# Patient Record
Sex: Male | Born: 1940
Health system: Southern US, Community
[De-identification: ages and names within clinical notes are randomized; demographics above are authoritative.]

## PROBLEM LIST (undated history)

## (undated) DIAGNOSIS — J309 Allergic rhinitis, unspecified: Secondary | ICD-10-CM

## (undated) DIAGNOSIS — G709 Myoneural disorder, unspecified: Secondary | ICD-10-CM

## (undated) DIAGNOSIS — N529 Male erectile dysfunction, unspecified: Secondary | ICD-10-CM

## (undated) DIAGNOSIS — M1990 Unspecified osteoarthritis, unspecified site: Secondary | ICD-10-CM

## (undated) DIAGNOSIS — N400 Enlarged prostate without lower urinary tract symptoms: Secondary | ICD-10-CM

## (undated) DIAGNOSIS — L02219 Cutaneous abscess of trunk, unspecified: Secondary | ICD-10-CM

## (undated) DIAGNOSIS — T7840XA Allergy, unspecified, initial encounter: Secondary | ICD-10-CM

## (undated) DIAGNOSIS — L03319 Cellulitis of trunk, unspecified: Secondary | ICD-10-CM

## (undated) DIAGNOSIS — C439 Malignant melanoma of skin, unspecified: Secondary | ICD-10-CM

## (undated) DIAGNOSIS — H9190 Unspecified hearing loss, unspecified ear: Secondary | ICD-10-CM

## (undated) DIAGNOSIS — H269 Unspecified cataract: Secondary | ICD-10-CM

## (undated) DIAGNOSIS — H6690 Otitis media, unspecified, unspecified ear: Secondary | ICD-10-CM

## (undated) DIAGNOSIS — K219 Gastro-esophageal reflux disease without esophagitis: Secondary | ICD-10-CM

## (undated) DIAGNOSIS — E785 Hyperlipidemia, unspecified: Secondary | ICD-10-CM

## (undated) HISTORY — DX: Unspecified cataract: H26.9

## (undated) HISTORY — DX: Myoneural disorder, unspecified: G70.9

## (undated) HISTORY — PX: POLYPECTOMY: SHX149

## (undated) HISTORY — DX: Allergy, unspecified, initial encounter: T78.40XA

## (undated) HISTORY — DX: Allergic rhinitis, unspecified: J30.9

## (undated) HISTORY — DX: Otitis media, unspecified, unspecified ear: H66.90

## (undated) HISTORY — DX: Cutaneous abscess of trunk, unspecified: L02.219

## (undated) HISTORY — DX: Benign prostatic hyperplasia without lower urinary tract symptoms: N40.0

## (undated) HISTORY — DX: Male erectile dysfunction, unspecified: N52.9

## (undated) HISTORY — DX: Hyperlipidemia, unspecified: E78.5

## (undated) HISTORY — DX: Unspecified hearing loss, unspecified ear: H91.90

## (undated) HISTORY — PX: COLONOSCOPY: SHX174

## (undated) HISTORY — PX: OTHER SURGICAL HISTORY: SHX169

## (undated) HISTORY — DX: Gastro-esophageal reflux disease without esophagitis: K21.9

## (undated) HISTORY — DX: Unspecified osteoarthritis, unspecified site: M19.90

## (undated) HISTORY — DX: Cellulitis of trunk, unspecified: L03.319

## (undated) HISTORY — DX: Malignant melanoma of skin, unspecified: C43.9

---

## 2003-03-07 HISTORY — PX: MELANOMA EXCISION: SHX5266

## 2004-11-29 ENCOUNTER — Ambulatory Visit: Payer: Self-pay | Admitting: Internal Medicine

## 2005-02-15 ENCOUNTER — Encounter: Admission: RE | Admit: 2005-02-15 | Discharge: 2005-02-15 | Payer: Self-pay | Admitting: General Surgery

## 2005-02-20 ENCOUNTER — Encounter (INDEPENDENT_AMBULATORY_CARE_PROVIDER_SITE_OTHER): Payer: Self-pay | Admitting: Specialist

## 2005-02-20 ENCOUNTER — Ambulatory Visit (HOSPITAL_BASED_OUTPATIENT_CLINIC_OR_DEPARTMENT_OTHER): Admission: RE | Admit: 2005-02-20 | Discharge: 2005-02-20 | Payer: Self-pay | Admitting: General Surgery

## 2005-02-20 ENCOUNTER — Ambulatory Visit (HOSPITAL_COMMUNITY): Admission: RE | Admit: 2005-02-20 | Discharge: 2005-02-20 | Payer: Self-pay | Admitting: General Surgery

## 2005-03-30 ENCOUNTER — Ambulatory Visit: Payer: Self-pay | Admitting: Internal Medicine

## 2006-08-10 ENCOUNTER — Ambulatory Visit: Payer: Self-pay | Admitting: Internal Medicine

## 2006-08-10 LAB — CONVERTED CEMR LAB
ALT: 16 units/L (ref 0–40)
Alkaline Phosphatase: 50 units/L (ref 39–117)
BUN: 16 mg/dL (ref 6–23)
Basophils Relative: 0.6 % (ref 0.0–1.0)
Bilirubin Urine: NEGATIVE
CO2: 30 meq/L (ref 19–32)
Calcium: 9 mg/dL (ref 8.4–10.5)
Eosinophils Absolute: 0 10*3/uL (ref 0.0–0.6)
GFR calc Af Amer: 126 mL/min
GFR calc non Af Amer: 104 mL/min
Ketones, ur: NEGATIVE mg/dL
LDL Cholesterol: 114 mg/dL — ABNORMAL HIGH (ref 0–99)
Lymphocytes Relative: 28.6 % (ref 12.0–46.0)
Monocytes Relative: 8.3 % (ref 3.0–11.0)
Neutro Abs: 3.2 10*3/uL (ref 1.4–7.7)
Platelets: 276 10*3/uL (ref 150–400)
Specific Gravity, Urine: 1.02 (ref 1.000–1.03)
Total CHOL/HDL Ratio: 3.8
Total Protein, Urine: NEGATIVE mg/dL
Triglycerides: 115 mg/dL (ref 0–149)
Urine Glucose: NEGATIVE mg/dL
VLDL: 23 mg/dL (ref 0–40)
pH: 6 (ref 5.0–8.0)

## 2006-10-03 ENCOUNTER — Ambulatory Visit: Payer: Self-pay | Admitting: Gastroenterology

## 2006-10-15 ENCOUNTER — Ambulatory Visit: Payer: Self-pay | Admitting: Gastroenterology

## 2006-10-15 LAB — HM COLONOSCOPY

## 2007-06-07 ENCOUNTER — Ambulatory Visit: Payer: Self-pay | Admitting: Internal Medicine

## 2007-06-07 DIAGNOSIS — J309 Allergic rhinitis, unspecified: Secondary | ICD-10-CM

## 2007-06-07 DIAGNOSIS — N400 Enlarged prostate without lower urinary tract symptoms: Secondary | ICD-10-CM

## 2007-06-07 DIAGNOSIS — E785 Hyperlipidemia, unspecified: Secondary | ICD-10-CM

## 2007-06-07 DIAGNOSIS — J019 Acute sinusitis, unspecified: Secondary | ICD-10-CM

## 2007-06-07 DIAGNOSIS — R399 Unspecified symptoms and signs involving the genitourinary system: Secondary | ICD-10-CM | POA: Insufficient documentation

## 2007-06-07 HISTORY — DX: Hyperlipidemia, unspecified: E78.5

## 2007-06-07 HISTORY — DX: Allergic rhinitis, unspecified: J30.9

## 2007-06-07 HISTORY — DX: Benign prostatic hyperplasia without lower urinary tract symptoms: N40.0

## 2008-05-11 ENCOUNTER — Ambulatory Visit: Payer: Self-pay | Admitting: Internal Medicine

## 2008-05-15 ENCOUNTER — Encounter: Payer: Self-pay | Admitting: Endocrinology

## 2008-10-09 ENCOUNTER — Ambulatory Visit: Payer: Self-pay | Admitting: Internal Medicine

## 2008-10-13 ENCOUNTER — Telehealth: Payer: Self-pay | Admitting: Internal Medicine

## 2009-02-04 ENCOUNTER — Ambulatory Visit: Payer: Self-pay | Admitting: Internal Medicine

## 2009-02-04 DIAGNOSIS — H6690 Otitis media, unspecified, unspecified ear: Secondary | ICD-10-CM

## 2009-02-04 DIAGNOSIS — R030 Elevated blood-pressure reading, without diagnosis of hypertension: Secondary | ICD-10-CM

## 2009-02-04 HISTORY — DX: Otitis media, unspecified, unspecified ear: H66.90

## 2009-02-05 ENCOUNTER — Telehealth: Payer: Self-pay | Admitting: Internal Medicine

## 2009-05-24 ENCOUNTER — Encounter: Payer: Self-pay | Admitting: Internal Medicine

## 2009-07-27 ENCOUNTER — Ambulatory Visit: Payer: Self-pay | Admitting: Internal Medicine

## 2009-07-27 DIAGNOSIS — L02219 Cutaneous abscess of trunk, unspecified: Secondary | ICD-10-CM

## 2009-07-27 DIAGNOSIS — L03319 Cellulitis of trunk, unspecified: Secondary | ICD-10-CM

## 2009-07-27 HISTORY — DX: Cutaneous abscess of trunk, unspecified: L02.219

## 2010-04-07 NOTE — Assessment & Plan Note (Signed)
Summary: DR Sammuel Cooper PT/NO CLINIC--TICK BITE/FLU LIKE SYMPTOMS---STC   Vital Signs:  Patient profile:   70 year old male Height:      70 inches Weight:      191 pounds BMI:     27.50 O2 Sat:      96 % on Room air Temp:     98.0 degrees F oral Pulse rate:   58 / minute Pulse rhythm:   regular Resp:     16 per minute BP sitting:   124 / 64  (left arm) Cuff size:   large  Vitals Entered By: Rock Nephew CMA (Jul 27, 2009 8:28 AM)  Nutrition Counseling: Patient's BMI is greater than 25 and therefore counseled on weight management options.  O2 Flow:  Room air CC: tick bite w/ weakness x tuesday, Preventive Care Is Patient Diabetic? No Pain Assessment Patient in pain? no        Primary Care Provider:  Corwin Levins MD  CC:  tick bite w/ weakness x tuesday and Preventive Care.  History of Present Illness: New to me this gentleman pulled a large tick off his right trapezium yesterday and since then complains of feeling achy and weak.  Preventive Screening-Counseling & Management  Alcohol-Tobacco     Smoking Status: quit  Clinical Review Panels:  Immunizations   Last Tetanus Booster:  Tdap (05/11/2008)   Last Flu Vaccine:  given (12/05/2007)   Last H1N1 Vaccine 1:  H1N1 vaccine G code (05/11/2008)   Last Pneumovax:  Pneumovax (05/11/2008)   Medications Prior to Update: 1)  Levitra 20 Mg Tabs (Vardenafil Hcl) .Marland Kitchen.. 1 By Mouth Once Daily As Needed 2)  Levaquin 500 Mg Tabs (Levofloxacin) .Marland Kitchen.. 1 By Mouth Once Daily 3)  Omnaris 50 Mcg/act Susp (Ciclesonide) .... 2 Spray/side Once Daily 4)  Fexofenadine Hcl 180 Mg Tabs (Fexofenadine Hcl) .Marland Kitchen.. 1 By Mouth Once Daily As Needed 5)  Meclizine Hcl 12.5 Mg Tabs (Meclizine Hcl) .Marland Kitchen.. 1 - 2 By Mouth Three Times A Day As Needed Dizzy  Current Medications (verified): 1)  Levitra 20 Mg Tabs (Vardenafil Hcl) .Marland Kitchen.. 1 By Mouth Once Daily As Needed 2)  Levaquin 500 Mg Tabs (Levofloxacin) .Marland Kitchen.. 1 By Mouth Once Daily 3)  Omnaris 50 Mcg/act Susp  (Ciclesonide) .... 2 Spray/side Once Daily 4)  Fexofenadine Hcl 180 Mg Tabs (Fexofenadine Hcl) .Marland Kitchen.. 1 By Mouth Once Daily As Needed 5)  Meclizine Hcl 12.5 Mg Tabs (Meclizine Hcl) .Marland Kitchen.. 1 - 2 By Mouth Three Times A Day As Needed Dizzy 6)  Doxycycline Hyclate 100 Mg Tabs (Doxycycline Hyclate) .... One By Mouth Two Times A Day For 14 Days  Allergies (verified): 1)  ! Augmentin (Amoxicillin-Pot Clavulanate)  Past History:  Past Medical History: Reviewed history from 05/11/2008 and no changes required. Allergic rhinitis hx of shingles hx of prostatitis E.D. Hyperlipidemia Benign prostatic hypertrophy - dr Wanda Plump  Past Surgical History: Reviewed history from 06/07/2007 and no changes required. Inguinal herniorrhaphy s/p left shoulder lipoma  Family History: Reviewed history from 06/07/2007 and no changes required. colon cancer - mother grandmother with MI father with sudden death 78 yo  Social History: Reviewed history from 06/07/2007 and no changes required. semi-retired - now pastor Former Smoker Alcohol use-yes 3 children Married  Review of Systems  The patient denies anorexia, fever, chest pain, syncope, headaches, hemoptysis, abdominal pain, and enlarged lymph nodes.   General:  Complains of fatigue and weakness; denies chills, fever, loss of appetite, malaise, sleep disorder, sweats, and weight loss.  Physical Exam  General:  alert, well-developed, well-nourished, well-hydrated, appropriate dress, normal appearance, healthy-appearing, and cooperative to examination.   Head:  normocephalic, atraumatic, no abnormalities observed, and no abnormalities palpated.   Mouth:  Oral mucosa and oropharynx without lesions or exudates.  Teeth in good repair. Neck:  supple, full ROM, no masses, no thyromegaly, no thyroid nodules or tenderness, no JVD, normal carotid upstroke, and no carotid bruits.   Lungs:  normal respiratory effort, no intercostal retractions, no accessory  muscle use, normal breath sounds, no dullness, no fremitus, no crackles, and no wheezes.   Heart:  normal rate, regular rhythm, no murmur, no gallop, no rub, and no JVD.   Abdomen:  soft, non-tender, normal bowel sounds, no distention, no masses, no guarding, no rigidity, no rebound tenderness, no hepatomegaly, and no splenomegaly.   Msk:  normal ROM, no joint tenderness, no joint swelling, no joint warmth, no redness over joints, no joint deformities, no joint instability, and no crepitation.   Pulses:  R and L carotid,radial,femoral,dorsalis pedis and posterior tibial pulses are full and equal bilaterally Extremities:  No clubbing, cyanosis, edema, or deformity noted with normal full range of motion of all joints.   Neurologic:  No cranial nerve deficits noted. Station and gait are normal. Plantar reflexes are down-going bilaterally. DTRs are symmetrical throughout. Sensory, motor and coordinative functions appear intact. Skin:  he has a tiny excoriation over his right posterior trapezium with a very small/subtle area of erythema but no FB, exudate, fluctuance, induration, streaking,  Cervical Nodes:  no anterior cervical adenopathy and no posterior cervical adenopathy.  there is no supraclavicular LAD. Axillary Nodes:  no R axillary adenopathy and no L axillary adenopathy.   Inguinal Nodes:  no R inguinal adenopathy and no L inguinal adenopathy.   Psych:  Cognition and judgment appear intact. Alert and cooperative with normal attention span and concentration. No apparent delusions, illusions, hallucinations   Impression & Recommendations:  Problem # 1:  CELLULITIS/ABSCESS, TRUNK (ICD-682.2) Assessment New will start an antibiotic that covers MRSA and follow The following medications were removed from the medication list:    Levaquin 500 Mg Tabs (Levofloxacin) .Marland Kitchen... 1 by mouth once daily His updated medication list for this problem includes:    Doxycycline Hyclate 100 Mg Tabs (Doxycycline  hyclate) ..... One by mouth two times a day for 14 days  Elevate affected area. Warm moist compresses for 20 minutes every 2 hours while awake. Take antibiotics as directed and take acetaminophen as needed. To be seen in 48-72 hours if no improvement, sooner if worse.  Problem # 2:  TICK BITE (ICD-E906.4) Assessment: New no evidence of systemic infection like RMSF, will observe on antibiotics for now.  Complete Medication List: 1)  Levitra 20 Mg Tabs (Vardenafil hcl) .Marland Kitchen.. 1 by mouth once daily as needed 2)  Omnaris 50 Mcg/act Susp (Ciclesonide) .... 2 spray/side once daily 3)  Fexofenadine Hcl 180 Mg Tabs (Fexofenadine hcl) .Marland Kitchen.. 1 by mouth once daily as needed 4)  Meclizine Hcl 12.5 Mg Tabs (Meclizine hcl) .Marland Kitchen.. 1 - 2 by mouth three times a day as needed dizzy 5)  Doxycycline Hyclate 100 Mg Tabs (Doxycycline hyclate) .... One by mouth two times a day for 14 days  PSA Screening:    PSA: normal per pt  (08/05/2007)  Immunization & Chemoprophylaxis:    Tetanus vaccine: Tdap  (05/11/2008)    Influenza vaccine: given  (12/05/2007)    Pneumovax: Pneumovax  (05/11/2008)  Patient Instructions: 1)  Please  schedule a follow-up appointment in 2 weeks. 2)  Take your antibiotic as prescribed until ALL of it is gone, but stop if you develop a rash or swelling and contact our office as soon as possible. Prescriptions: DOXYCYCLINE HYCLATE 100 MG TABS (DOXYCYCLINE HYCLATE) One by mouth two times a day for 14 days  #28 x 1   Entered and Authorized by:   Etta Grandchild MD   Signed by:   Etta Grandchild MD on 07/27/2009   Method used:   Electronically to        [facility] [address] (retail)       [address] [place], [place]  [address]       Ph: [phone]       Fax: [phone]   RxID:   [id number]    Not Administered:    Influenza Vaccine not given due to: vaccine availability

## 2010-04-07 NOTE — Letter (Signed)
Summary: [facility]   Imported By: Sherian Rein 05/27/2009 09:23:41  _____________________________________________________________________  External Attachment:    Type:   Image     Comment:   External Document

## 2010-06-18 ENCOUNTER — Encounter: Payer: Self-pay | Admitting: Internal Medicine

## 2010-06-18 DIAGNOSIS — Z0000 Encounter for general adult medical examination without abnormal findings: Secondary | ICD-10-CM | POA: Insufficient documentation

## 2010-06-18 DIAGNOSIS — Z0001 Encounter for general adult medical examination with abnormal findings: Secondary | ICD-10-CM | POA: Insufficient documentation

## 2010-06-21 ENCOUNTER — Ambulatory Visit: Payer: Self-pay | Admitting: Internal Medicine

## 2010-06-23 ENCOUNTER — Other Ambulatory Visit (INDEPENDENT_AMBULATORY_CARE_PROVIDER_SITE_OTHER): Payer: BC Managed Care – PPO

## 2010-06-23 ENCOUNTER — Encounter: Payer: Self-pay | Admitting: Internal Medicine

## 2010-06-23 ENCOUNTER — Ambulatory Visit (INDEPENDENT_AMBULATORY_CARE_PROVIDER_SITE_OTHER): Payer: BC Managed Care – PPO | Admitting: Internal Medicine

## 2010-06-23 VITALS — BP 118/68 | HR 62 | Temp 98.5°F | Ht 72.0 in | Wt 194.2 lb

## 2010-06-23 DIAGNOSIS — Z0000 Encounter for general adult medical examination without abnormal findings: Secondary | ICD-10-CM

## 2010-06-23 DIAGNOSIS — N400 Enlarged prostate without lower urinary tract symptoms: Secondary | ICD-10-CM

## 2010-06-23 DIAGNOSIS — N390 Urinary tract infection, site not specified: Secondary | ICD-10-CM

## 2010-06-23 DIAGNOSIS — J329 Chronic sinusitis, unspecified: Secondary | ICD-10-CM

## 2010-06-23 DIAGNOSIS — N529 Male erectile dysfunction, unspecified: Secondary | ICD-10-CM

## 2010-06-23 HISTORY — DX: Male erectile dysfunction, unspecified: N52.9

## 2010-06-23 LAB — BASIC METABOLIC PANEL
BUN: 15 mg/dL (ref 6–23)
Calcium: 8.9 mg/dL (ref 8.4–10.5)
GFR: 111.94 mL/min (ref >60.00)
Glucose, Bld: 78 mg/dL (ref 70–99)

## 2010-06-23 LAB — CBC WITH DIFFERENTIAL/PLATELET
Eosinophils Relative: 1.1 % (ref 0.0–5.0)
HCT: 43.6 % (ref 39.0–52.0)
Hemoglobin: 14.8 g/dL (ref 13.0–17.0)
Lymphocytes Relative: 26.7 % (ref 12.0–46.0)
Lymphs Abs: 1.5 10*3/uL (ref 0.7–4.0)
Monocytes Relative: 10 % (ref 3.0–12.0)
Neutro Abs: 3.4 10*3/uL (ref 1.4–7.7)
Platelets: 266 10*3/uL (ref 150.0–400.0)
WBC: 5.6 10*3/uL (ref 4.5–10.5)

## 2010-06-23 LAB — URINALYSIS, ROUTINE W REFLEX MICROSCOPIC
Ketones, ur: NEGATIVE
Leukocytes, UA: NEGATIVE
Nitrite: NEGATIVE
Specific Gravity, Urine: 1.02 (ref 1.000–1.030)
pH: 6 (ref 5.0–8.0)

## 2010-06-23 LAB — LIPID PANEL
Cholesterol: 183 mg/dL (ref 0–200)
HDL: 76.6 mg/dL (ref >39.00)
Triglycerides: 33 mg/dL (ref 0.0–149.0)
VLDL: 6.6 mg/dL (ref 0.0–40.0)

## 2010-06-23 LAB — TSH: TSH: 1.32 u[IU]/mL (ref 0.35–5.50)

## 2010-06-23 LAB — HEPATIC FUNCTION PANEL: Albumin: 4 g/dL (ref 3.5–5.2)

## 2010-06-23 MED ORDER — FLUTICASONE PROPIONATE 50 MCG/ACT NA SUSP: 2.0000 | Freq: Every day | NASAL | Status: DC

## 2010-06-23 MED ORDER — VARDENAFIL HCL 20 MG PO TABS: 20.0000 mg | ORAL_TABLET | Freq: Every day | ORAL | Status: DC | PRN

## 2010-06-23 NOTE — Progress Notes (Signed)
Quick Note:  Voice message left on [employer] system - lab is negative, normal or otherwise stable, pt to continue same tx ______ 

## 2010-06-23 NOTE — Assessment & Plan Note (Signed)
With right eustachain valve, for flonase, and refer to ent per pt request

## 2010-06-23 NOTE — Assessment & Plan Note (Signed)
For refill levitra

## 2010-06-23 NOTE — Assessment & Plan Note (Signed)
Exam benign, to check UA per pt request

## 2010-06-23 NOTE — Assessment & Plan Note (Signed)
Pt reqests referral to new urologist

## 2010-06-23 NOTE — Patient Instructions (Signed)
Take all new medications as prescribed Continue all other medications as before You will be contacted regarding the referral for: urology, and ENT Please return in 1 year for your yearly visit, or sooner if needed, with Lab testing done 3-5 days before

## 2010-06-23 NOTE — Assessment & Plan Note (Signed)

## 2010-06-23 NOTE — Progress Notes (Signed)
Subjective:    Patient ID: CHIRSTOPHER Fischer, male    DOB: 1941/03/01, 70 y.o.   MRN: [id number]  HPI  Here for wellness and f/u;  Overall doing ok;  Pt denies CP, worsening SOB, DOE, wheezing, orthopnea, PND, worsening LE edema, palpitations, dizziness or syncope.  Pt denies neurological change such as new Headache, facial or extremity weakness.  Pt denies polydipsia, polyuria, or low sugar symptoms. Pt states overall good compliance with treatment and medications, good tolerability, and trying to follow lower cholesterol diet.  Pt denies worsening depressive symptoms, suicidal ideation or panic. No fever, wt loss, night sweats, loss of appetite, or other constitutional symptoms.  Pt states good ability with ADL's, low fall risk, home safety reviewed and adequate, no significant changes in hearing or vision, and occasionally active with exercise  Did have some mild dysuria last wk, better with cranberry, symtpoms resolved, wants to check urine studies today to f/u.  Has known BPH, with most recent urologist no longer in [place], will need new referral.  Last PSA > 1 yr.  Also with long hx of chronic sinusitis , has not really been using the flonase, but did use the allegra for about 18 mo until symptoms seemed worse, to stopped thinking the allegra might be making things worse.  Has popping and pressure symtpoms occas to right ear with hx of vertigo with it but none recetnly.  No fever, HA, sT, cough recently.  Needs refill levitra.   Past Medical History  Diagnosis Date  . HYPERLIPIDEMIA 06/07/2007  . OTITIS MEDIA, ACUTE, RIGHT 02/04/2009  . SINUSITIS- ACUTE-NOS 06/07/2007  . ALLERGIC RHINITIS 06/07/2007  . BENIGN PROSTATIC HYPERTROPHY 06/07/2007  . CELLULITIS/ABSCESS, TRUNK 07/27/2009  . ELEVATED BLOOD PRESSURE WITHOUT DIAGNOSIS OF HYPERTENSION 02/04/2009  . Chronic sinusitis 06/23/2010  . Erectile dysfunction 06/23/2010   Past Surgical History  Procedure Date  . Inguinal herniorrhapy   . S/p left shoulder lipoma      reports that he has quit smoking. He does not have any smokeless tobacco history on file. He reports that he drinks alcohol. His drug history not on file. family history includes Cancer in his mother and Heart attack in his other. Allergies  Allergen Reactions  . WUJ:[id number]+AOZHYQMVH+QIONGEXBMW Acid+Aspartame     REACTION: rash   Current Outpatient Prescriptions on File Prior to Visit  Medication Sig Dispense Refill  . DISCONTD: vardenafil (LEVITRA) 20 MG tablet Take 20 mg by mouth daily as needed.        . fexofenadine (ALLEGRA) 180 MG tablet Take 180 mg by mouth daily.        Marland Kitchen DISCONTD: ciclesonide (OMNARIS) 50 MCG/ACT nasal spray 2 sprays by Each Nare route daily.        Marland Kitchen DISCONTD: doxycycline (VIBRAMYCIN) 100 MG capsule Take 100 mg by mouth 2 (two) times daily. For 14 days       . DISCONTD: meclizine (ANTIVERT) 12.5 MG tablet Take 12.5 mg by mouth. 1-2 by mouth three times a day as needed for dizziness        Review of Systems Review of Systems  Constitutional: Negative for diaphoresis, activity change, appetite change and unexpected weight change.  HENT: Negative for hearing loss, ear pain, facial swelling, mouth sores and neck stiffness.   Eyes: Negative for pain, redness and visual disturbance.  Respiratory: Negative for shortness of breath and wheezing.   Cardiovascular: Negative for chest pain and palpitations.  Gastrointestinal: Negative for diarrhea, blood in stool, abdominal distention and rectal pain.  Genitourinary: Negative for hematuria, flank pain and decreased urine volume.  Musculoskeletal: Negative for myalgias and joint swelling.  Skin: Negative for color change and wound.  Neurological: Negative for syncope and numbness.  Hematological: Negative for adenopathy.  Psychiatric/Behavioral: Negative for hallucinations, self-injury, decreased concentration and agitation.      Objective:   Physical Exam BP 118/68  Pulse 62  Temp(Src) 98.5 F (36.9 C)  (Oral)  Ht 6' (1.829 m)  Wt 194 lb 4 oz (88.111 kg)  BMI 26.34 kg/m2  SpO2 95% Physical Exam  VS noted Constitutional: Pt is oriented to person, place, and time. Appears well-developed and well-nourished.  HENT:  Head: Normocephalic and atraumatic.  Right Ear: External ear normal.  Left Ear: External ear normal. bilat tm's mild erythema Nose: Nose normal. Sinus nontender Mouth/Throat: Oropharynx is clear and moist.  Eyes: Conjunctivae and EOM are normal. Pupils are equal, round, and reactive to light.  Neck: Normal range of motion. Neck supple. No JVD present. No tracheal deviation present.  Cardiovascular: Normal rate, regular rhythm, normal heart sounds and intact distal pulses.   Pulmonary/Chest: Effort normal and breath sounds normal.  Abdominal: Soft. Bowel sounds are normal. There is no tenderness.  Musculoskeletal: Normal range of motion. Exhibits no edema.  Lymphadenopathy:  Has no cervical adenopathy.  Neurological: Pt is alert and oriented to person, place, and time. Pt has normal reflexes. No cranial nerve deficit.  Skin: Skin is warm and dry. No rash noted.  Psychiatric:  Has  normal mood and affect. Behavior is normal.         Assessment & Plan:

## 2010-06-25 LAB — URINE CULTURE

## 2010-07-22 NOTE — Op Note (Signed)
NAME:  Shane Fischer, Shane Fischer                ACCOUNT NO.:  [id number]   MEDICAL RECORD NO.:  [id number]          PATIENT TYPE:  AMB   LOCATION:  DSC                          FACILITY:  [facility]   PHYSICIAN:  Adolph Pollack, M.D.DATE OF BIRTH:  [birth year]   DATE OF PROCEDURE:  02/20/2005  DATE OF DISCHARGE:                                 OPERATIVE REPORT   PREOPERATIVE DIAGNOSES:  1.  Right inguinal hernia.  2.  A 10 cm soft tissue mass left upper extremity.   POSTOPERATIVE DIAGNOSES:  1.  Right inguinal hernia.  2.  A 10 cm soft tissue mass left upper extremity.   PROCEDURES:  1.  Right inguinal hernia repair with mesh.  2.  Excision of 10 cm soft tissue mass left upper extremity.   SURGEON:  Adolph Pollack, M.D.   ANESTHESIA:  General with 0.5% Marcaine for local.   INDICATION:  Shane Fischer is a 70 year old male who, on the superior aspect  of the left upper extremity, has been noting a small amount of swelling and  then growth. He had an obvious soft tissue mass that measures about 10 cm  that has been slowly growing. Appears to be in the subcutaneous tissue. He  also noted a right inguinal bulge. He had had a previous left inguinal  hernia repair the past. He indeed has a right inguinal hernia and now  presents for the above procedures. We discussed the procedures, the risks  and the aftercare.   TECHNIQUE:  He is seen in the holding area. My initials were placed on the  soft tissue mass of left upper extremity and the right groin. He is then  brought to the operating room, placed supine on the operating table and  general anesthetic was administered. The hair in the right groin was clipped  and the right groin was sterilely prepped and draped. Dilute Marcaine  solution was infiltrated superficially and deep in the right groin and  incision made in the right groin through the skin, subcutaneous tissue and  Scarpa's fascia until the external oblique aponeurosis was  identified. Local  anesthetic was infiltrated deep to the external oblique aponeurosis and an  incision was made in the external oblique aponeurosis through the external  ring medially and up toward the anterior superior iliac spine laterally. The  underlying ilioinguinal nerve was identified and retracted laterally.  Spermatic cord was isolated and a direct hernia defect was noted and some of  the sac and contents were dissected free from the spermatic cord. The cord  was inspected and no indirect sac was noted. I then mobilize cord  posteriorly and encircled it with a Penrose drain and retracted it  superiorly.  I then used blunt dissection to identify the internal oblique  muscle and aponeurosis superiorly and the shelving edge of the inguinal  ligament inferiorly. A piece of 3 x 6 inches polypropylene mesh was brought  into the field and anchored 1 cm medial to the pubic tubercle with 2-0  Prolene suture. The inferior aspect of mesh was then anchored to the  shelving edge  of the inguinal ligament with a running 2-0 Prolene suture up  to a level 1 cm lateral to the internal ring at which point it was tied  down. A slit was cut in the mesh and the two tails wrapped around the cord.  The superior portion of the mesh was then anchored to the internal oblique  muscle and aponeurosis with interrupted 2-0 Vicryl sutures. This more than  adequately covered the defect.  The two tails were crossed creating a new  internal ring and these were anchored to the shelving edge of the inguinal  ligament with 2-0 Prolene suture. The cord was mobile and the tip of the  hemostat was able to be placed to the new aperture.   Following this, hemostasis was noted be adequate. I then closed the external  oblique aponeurosis with running 3-0 Vicryl suture. Scarpa's fascia was  closed with a running 2-0 Vicryl suture. The skin was closed with a 4-0  Monocryl subcuticular stitch followed by Steri-Strips and  sterile dressing.   Following this, gloves were changed. The area of the left shoulder and left  upper arm was then sterilely prepped and draped. Local anesthetic was  infiltrated in a longitudinal fashion and a longitudinal incision was made  directly over the soft tissue mesh through the skin and subcutaneous tissue.  It appeared to be a multilobulated soft tissue mass which was I was able to  excise by using both blunt and sharp dissection. It measured approximately  10 cm.   I inspected the wound and bleeding points were controlled with  electrocautery. I then closed the wound in two layers, closing the  subcutaneous tissue with a  running 3-0 Monocryl suture and closing the skin  with a running 3-0 Monocryl subcuticular stitch. Steri-Strips and sterile  dressings were applied.   He tolerated both procedures well without any apparent complications and was  taken to the recovery room in satisfactory condition. He will be given  discharge instructions and Tylox for pain and follow up in the office in two  to three weeks.      Adolph Pollack, M.D.  Electronically Signed     TJR/MEDQ  D:  02/20/2005  T:  02/21/2005  Job:  [id number]   cc:   Shane Fischer, M.D. [facility]  [address]  [place]  [place] [address]

## 2011-09-11 ENCOUNTER — Telehealth: Payer: Self-pay | Admitting: Internal Medicine

## 2011-09-11 NOTE — Telephone Encounter (Signed)
Pt scheduled  

## 2011-09-11 NOTE — Telephone Encounter (Signed)
Ok to try to see tomorrow, please assist with appt

## 2011-09-11 NOTE — Telephone Encounter (Signed)
°  Caller: Shane Fischer/Patient; PCP: Oliver Barre; CB#: [phone];  Call regarding Tick Bite;  States he has had tick bites in the past, but has not pulled one off of him lately.  He thinks pain is because of a bite.  Afebrile.   Triaged Bites and Stings Insects and all emergent SX R/O.  Disp = needs to be seen in 4 hrs for eval due to joint muscle pain. No appt open in [facility].   Please call pt back at above #.

## 2011-09-12 ENCOUNTER — Encounter: Payer: Self-pay | Admitting: Gastroenterology

## 2011-09-12 ENCOUNTER — Other Ambulatory Visit (INDEPENDENT_AMBULATORY_CARE_PROVIDER_SITE_OTHER): Payer: BC Managed Care – PPO

## 2011-09-12 ENCOUNTER — Ambulatory Visit (INDEPENDENT_AMBULATORY_CARE_PROVIDER_SITE_OTHER): Payer: BC Managed Care – PPO | Admitting: Internal Medicine

## 2011-09-12 ENCOUNTER — Encounter: Payer: Self-pay | Admitting: Internal Medicine

## 2011-09-12 VITALS — BP 132/70 | HR 60 | Temp 97.7°F | Ht 72.0 in | Wt 192.1 lb

## 2011-09-12 DIAGNOSIS — C439 Malignant melanoma of skin, unspecified: Secondary | ICD-10-CM | POA: Insufficient documentation

## 2011-09-12 DIAGNOSIS — S90466A Insect bite (nonvenomous), unspecified lesser toe(s), initial encounter: Secondary | ICD-10-CM

## 2011-09-12 DIAGNOSIS — J329 Chronic sinusitis, unspecified: Secondary | ICD-10-CM

## 2011-09-12 DIAGNOSIS — Z0000 Encounter for general adult medical examination without abnormal findings: Secondary | ICD-10-CM

## 2011-09-12 DIAGNOSIS — L089 Local infection of the skin and subcutaneous tissue, unspecified: Secondary | ICD-10-CM | POA: Insufficient documentation

## 2011-09-12 DIAGNOSIS — T148XXA Other injury of unspecified body region, initial encounter: Secondary | ICD-10-CM

## 2011-09-12 HISTORY — DX: Malignant melanoma of skin, unspecified: C43.9

## 2011-09-12 LAB — URINALYSIS, ROUTINE W REFLEX MICROSCOPIC
Bilirubin Urine: NEGATIVE
Ketones, ur: NEGATIVE
Leukocytes, UA: NEGATIVE
Nitrite: NEGATIVE
Urobilinogen, UA: 0.2 (ref 0.0–1.0)

## 2011-09-12 LAB — CBC WITH DIFFERENTIAL/PLATELET
Eosinophils Relative: 1.2 % (ref 0.0–5.0)
HCT: 45.4 % (ref 39.0–52.0)
Hemoglobin: 15 g/dL (ref 13.0–17.0)
Lymphs Abs: 1.5 10*3/uL (ref 0.7–4.0)
MCV: 96.8 fl (ref 78.0–100.0)
Monocytes Absolute: 0.5 10*3/uL (ref 0.1–1.0)
Monocytes Relative: 8.9 % (ref 3.0–12.0)
Neutro Abs: 3.3 10*3/uL (ref 1.4–7.7)
Platelets: 254 10*3/uL (ref 150.0–400.0)
RDW: 15.2 % — ABNORMAL HIGH (ref 11.5–14.6)
WBC: 5.4 10*3/uL (ref 4.5–10.5)

## 2011-09-12 LAB — LIPID PANEL
Cholesterol: 231 mg/dL — ABNORMAL HIGH (ref 0–200)
HDL: 77.9 mg/dL (ref >39.00)
VLDL: 7.6 mg/dL (ref 0.0–40.0)

## 2011-09-12 LAB — TSH: TSH: 1.23 u[IU]/mL (ref 0.35–5.50)

## 2011-09-12 LAB — BASIC METABOLIC PANEL
BUN: 15 mg/dL (ref 6–23)
Calcium: 9 mg/dL (ref 8.4–10.5)
Creatinine, Ser: 0.8 mg/dL (ref 0.4–1.5)
GFR: 100.49 mL/min (ref >60.00)

## 2011-09-12 LAB — HEPATIC FUNCTION PANEL
ALT: 19 U/L (ref 0–53)
AST: 20 U/L (ref 0–37)
Albumin: 4 g/dL (ref 3.5–5.2)
Total Bilirubin: 1.3 mg/dL — ABNORMAL HIGH (ref 0.3–1.2)

## 2011-09-12 MED ORDER — MONTELUKAST SODIUM 10 MG PO TABS: 10.0000 mg | ORAL_TABLET | Freq: Every day | ORAL | Status: DC

## 2011-09-12 MED ORDER — DOXYCYCLINE HYCLATE 100 MG PO TABS: 100.0000 mg | ORAL_TABLET | Freq: Two times a day (BID) | ORAL | Status: AC

## 2011-09-12 NOTE — Assessment & Plan Note (Signed)
Mild to mod, for doxy course,  to f/u any worsening symptoms or concerns 

## 2011-09-12 NOTE — Assessment & Plan Note (Signed)
Ok to try trial of singulari 10 qd

## 2011-09-12 NOTE — Progress Notes (Signed)
Subjective:    Patient ID: Shane Fischer, male    DOB: [birth year], 71 y.o.   MRN: [id number]  HPI Here for wellness and f/u;  Overall doing ok;  Pt denies CP, worsening SOB, DOE, wheezing, orthopnea, PND, worsening LE edema, palpitations, dizziness or syncope.  Pt denies neurological change such as new Headache, facial or extremity weakness.  Pt denies polydipsia, polyuria, or low sugar symptoms. Pt states overall good compliance with treatment and medications, good tolerability, and trying to follow lower cholesterol diet.  Pt denies worsening depressive symptoms, suicidal ideation or panic. No fever, wt loss, night sweats, loss of appetite, or other constitutional symptoms.  Pt states good ability with ADL's, low fall risk, home safety reviewed and adequate, no significant changes in hearing or vision, and occasionally active with exercise.  Did have weakness, tired, malaise, myalgias for last 3-4 days; was bit by tick 2 wks ago, no rash, but did feel mild feverish.  Overall a little bit better today.  [place]  Was seeing ENT, derm and urology in [place] recently.  Asks for singulair trial for allergies as antihist does not seem to control his nasal symtpoms well enough. Past Medical History  Diagnosis Date  . HYPERLIPIDEMIA 06/07/2007  . OTITIS MEDIA, ACUTE, RIGHT 02/04/2009  . SINUSITIS- ACUTE-NOS 06/07/2007  . ALLERGIC RHINITIS 06/07/2007  . BENIGN PROSTATIC HYPERTROPHY 06/07/2007  . CELLULITIS/ABSCESS, TRUNK 07/27/2009  . ELEVATED BLOOD PRESSURE WITHOUT DIAGNOSIS OF HYPERTENSION 02/04/2009  . Chronic sinusitis 06/23/2010  . Erectile dysfunction 06/23/2010   Past Surgical History  Procedure Date  . Inguinal herniorrhapy   . S/p left shoulder lipoma     reports that he has quit smoking. He does not have any smokeless tobacco history on file. He reports that he drinks alcohol. His drug history not on file. family history includes Cancer in his mother and Heart attack in his  other. Allergies  Allergen Reactions  . Amoxicillin-Pot Clavulanate     REACTION: rash   Current Outpatient Prescriptions on File Prior to Visit  Medication Sig Dispense Refill  . mometasone (NASONEX) 50 MCG/ACT nasal spray Place 2 sprays into the nose daily.      . vardenafil (LEVITRA) 20 MG tablet Take 1 tablet (20 mg total) by mouth daily as needed.  10 tablet  1  . fluticasone (FLONASE) 50 MCG/ACT nasal spray 2 sprays by Nasal route daily.  16 g  2   Review of Systems Review of Systems  Constitutional: Negative for diaphoresis, activity change, appetite change and unexpected weight change.  HENT: Negative for hearing loss, ear pain, facial swelling, mouth sores and neck stiffness.   Eyes: Negative for pain, redness and visual disturbance.  Respiratory: Negative for shortness of breath and wheezing.   Cardiovascular: Negative for chest pain and palpitations.  Gastrointestinal: Negative for diarrhea, blood in stool, abdominal distention and rectal pain.  Genitourinary: Negative for hematuria, flank pain and decreased urine volume.  Musculoskeletal: Negative for myalgias and joint swelling.  Skin: Negative for color change and wound.  Neurological: Negative for syncope and numbness.  Hematological: Negative for adenopathy.  Psychiatric/Behavioral: Negative for hallucinations, self-injury, decreased concentration and agitation.     Objective:   Physical Exam BP 132/70  Pulse 60  Temp 97.7 F (36.5 C) (Oral)  Ht 6' (1.829 m)  Wt 192 lb 2 oz (87.147 kg)  BMI 26.06 kg/m2  SpO2 93% Physical Exam  VS noted Constitutional: Pt is oriented to person, place, and  time. Appears well-developed and well-nourished.  HENT:  Head: Normocephalic and atraumatic.  Right Ear: External ear normal.  Left Ear: External ear normal.  Nose: Nose normal.  Mouth/Throat: Oropharynx is clear and moist.  Eyes: Conjunctivae and EOM are normal. Pupils are equal, round, and reactive to light.  Neck:  Normal range of motion. Neck supple. No JVD present. No tracheal deviation present.  Cardiovascular: Normal rate, regular rhythm, normal heart sounds and intact distal pulses.   Pulmonary/Chest: Effort normal and breath sounds normal.  Abdominal: Soft. Bowel sounds are normal. There is no tenderness.  Musculoskeletal: Normal range of motion. Exhibits no edema.  Lymphadenopathy:  Has no cervical adenopathy.  Neurological: Pt is alert and oriented to person, place, and time. Pt has normal reflexes. No cranial nerve deficit.  Skin: Skin is warm and dry. No rash noted.  Psychiatric:  Has  normal mood and affect. Behavior is normal.     Assessment & Plan:

## 2011-09-12 NOTE — Patient Instructions (Addendum)
Take all new medications as prescribed Continue all other medications as before Please have the pharmacy call with any refills you may need. Please see GI office today to schedule your follow up colonoscopy You are otherwise up to date with prevention Please keep your appointments with your specialists as you have planned  - dermatology and urology Please go to LAB in the Basement for the blood and/or urine tests to be done today You will be contacted by phone if any changes need to be made immediately.  Otherwise, you will receive a letter about your results with an explanation. Please return in 1 year for your yearly visit, or sooner if needed, with Lab testing done 3-5 days before

## 2011-09-12 NOTE — Assessment & Plan Note (Signed)

## 2011-09-13 ENCOUNTER — Encounter: Payer: Self-pay | Admitting: Internal Medicine

## 2011-09-13 ENCOUNTER — Other Ambulatory Visit: Payer: Self-pay | Admitting: Internal Medicine

## 2011-09-13 LAB — ROCKY MTN SPOTTED FVR ABS PNL(IGG+IGM): RMSF IgM: 0.31 IV

## 2011-09-13 MED ORDER — ATORVASTATIN CALCIUM 10 MG PO TABS: 10.0000 mg | ORAL_TABLET | Freq: Every day | ORAL | Status: DC

## 2011-10-04 ENCOUNTER — Ambulatory Visit (AMBULATORY_SURGERY_CENTER): Payer: BC Managed Care – PPO | Admitting: *Deleted

## 2011-10-04 VITALS — Ht 72.5 in | Wt 189.6 lb

## 2011-10-04 DIAGNOSIS — Z1211 Encounter for screening for malignant neoplasm of colon: Secondary | ICD-10-CM

## 2011-10-04 MED ORDER — MOVIPREP 100 G PO SOLR: ORAL | Status: DC

## 2011-10-05 ENCOUNTER — Encounter: Payer: Self-pay | Admitting: Gastroenterology

## 2011-10-18 ENCOUNTER — Ambulatory Visit (AMBULATORY_SURGERY_CENTER): Payer: BC Managed Care – PPO | Admitting: Gastroenterology

## 2011-10-18 ENCOUNTER — Encounter: Payer: Self-pay | Admitting: Gastroenterology

## 2011-10-18 VITALS — BP 155/83 | HR 70 | Temp 96.4°F | Resp 19 | Ht 72.0 in | Wt 189.0 lb

## 2011-10-18 DIAGNOSIS — D126 Benign neoplasm of colon, unspecified: Secondary | ICD-10-CM

## 2011-10-18 DIAGNOSIS — K5730 Diverticulosis of large intestine without perforation or abscess without bleeding: Secondary | ICD-10-CM

## 2011-10-18 DIAGNOSIS — Z1211 Encounter for screening for malignant neoplasm of colon: Secondary | ICD-10-CM

## 2011-10-18 MED ORDER — SODIUM CHLORIDE 0.9 % IV SOLN: 500.0000 mL | INTRAVENOUS | Status: DC

## 2011-10-18 NOTE — Progress Notes (Signed)
Dr. Jarold Motto cuaterized a a polyp at the hepatic flexure but no tissue was retieved on it. Maw

## 2011-10-18 NOTE — Progress Notes (Signed)
Patient did not experience any of the following events: a burn prior to discharge; a fall within the facility; wrong site/side/patient/procedure/implant event; or a hospital transfer or hospital admission upon discharge from the facility. (G8907) Patient did not have preoperative order for IV antibiotic SSI prophylaxis. (G8918)  

## 2011-10-18 NOTE — Progress Notes (Signed)
The pt tolerated the colonoscopy very well. Maw   

## 2011-10-18 NOTE — Patient Instructions (Addendum)

## 2011-10-18 NOTE — Op Note (Signed)
[facility] [address] [place], [place]  [address]  COLONOSCOPY PROCEDURE REPORT  PATIENT:  Shane Fischer, Shane Fischer  MR#:  [id number] BIRTHDATE:  03/13/1940, 70 yrs. old  GENDER:  male ENDOSCOPIST:  Vania Rea. Jarold Motto, MD, [facility] REF. BY: PROCEDURE DATE:  10/18/2011 PROCEDURE:  Colonoscopy with snare polypectomy ASA CLASS:  Class II INDICATIONS:  family history of colon cancer, history of pre-cancerous (adenomatous) colon polyps MEDICATIONS:   propofol (Diprivan) 225 mg IV  DESCRIPTION OF PROCEDURE:   After the risks and benefits and of the procedure were explained, informed consent was obtained. Digital rectal exam was performed and revealed no abnormalities. The LB [id number] endoscope was introduced through the anus and advanced to the cecum, which was identified by both the appendix and ileocecal valve.  The quality of the prep was Moviprep fair.  The instrument was then slowly withdrawn as the colon was fully examined. <<PROCEDUREIMAGES>>  FINDINGS:  ENDOSCOPIC FINDINGS:   A sessile polyp was found at the hepatic flexure.  CM. SESSILE POLYP HOT SNARE EXCISED FROM HEPATIC FLEXURE.JAR #1. Moderate diverticulosis was found in the sigmoid to descending colon segments.  A sessile polyp was found in the mid transverse colon. 6 MM SESSILE TV COLON POLYP HOT SNARE REMOVED.JAR #2.  A sessile polyp was found in the rectum. FRIABLE 1 CM. RECTAL FLAT POLYP HOT SNARE REMOVED.JAR #3.   Retroflexed views in the rectum revealed no abnormalities.    The scope was then withdrawn from the patient and the procedure completed.  COMPLICATIONS:  None ENDOSCOPIC IMPRESSION: 1) Moderate diverticulosis in the sigmoid to descending colon segments 2) Sessile polyp at the hepatic flexure 3) Sessile polyp in the mid transverse colon 4) Sessile polyp in the rectum MULTIPLE POLYPS AND ++ FH.HIGH RISK PATIENT. RECOMMENDATIONS: 1) Await biopsy results 2) Repeat Colonoscopy in 3 years. 3)  High fiber diet  REPEAT EXAM:  No  ______________________________ Vania Rea. Jarold Motto, MD, [facility]  CC:  Corwin Levins, MD  n. Rosalie DoctorMarland Kitchen   Vania Rea. Patterson at 10/18/2011 11:04 AM  Lexine Baton, [phone]

## 2011-10-19 ENCOUNTER — Telehealth: Payer: Self-pay

## 2011-10-19 NOTE — Telephone Encounter (Signed)
  Follow up Call-  Call back number 10/18/2011  Post procedure Call Back phone  # cell [phone]  Permission to leave phone message Yes     Patient questions:  Do you have a fever, pain , or abdominal swelling? no Pain Score  0 *  Have you tolerated food without any problems? yes  Have you been able to return to your normal activities? yes  Do you have any questions about your discharge instructions: Diet   no Medications  no Follow up visit  no  Do you have questions or concerns about your Care? no  Actions: * If pain score is 4 or above: No action needed, pain <4.

## 2011-10-23 ENCOUNTER — Encounter: Payer: Self-pay | Admitting: Gastroenterology

## 2012-02-07 ENCOUNTER — Ambulatory Visit (INDEPENDENT_AMBULATORY_CARE_PROVIDER_SITE_OTHER): Payer: BC Managed Care – PPO | Admitting: Internal Medicine

## 2012-02-07 ENCOUNTER — Encounter: Payer: Self-pay | Admitting: Internal Medicine

## 2012-02-07 VITALS — BP 120/80 | HR 59 | Temp 98.0°F | Ht 72.0 in | Wt 193.4 lb

## 2012-02-07 DIAGNOSIS — J209 Acute bronchitis, unspecified: Secondary | ICD-10-CM | POA: Insufficient documentation

## 2012-02-07 DIAGNOSIS — E785 Hyperlipidemia, unspecified: Secondary | ICD-10-CM

## 2012-02-07 DIAGNOSIS — J309 Allergic rhinitis, unspecified: Secondary | ICD-10-CM

## 2012-02-07 DIAGNOSIS — Z23 Encounter for immunization: Secondary | ICD-10-CM

## 2012-02-07 MED ORDER — LEVOFLOXACIN 250 MG PO TABS: 250.0000 mg | ORAL_TABLET | Freq: Every day | ORAL | Status: DC

## 2012-02-07 MED ORDER — HYDROCODONE-HOMATROPINE 5-1.5 MG/5ML PO SYRP: 5.0000 mL | ORAL_SOLUTION | Freq: Four times a day (QID) | ORAL | Status: DC | PRN

## 2012-02-07 NOTE — Patient Instructions (Addendum)
You had the flu shot today Take all new medications as prescribed  - the antibiotic, and cough medicine Continue all other medications as before Please have the pharmacy call with any other refills you may need. Please continue your efforts at being more active, low cholesterol diet, and weight control. Thank you for enrolling in MyChart. Please follow the instructions below to securely access your online medical record. MyChart allows you to send messages to your doctor, view your test results, renew your prescriptions, schedule appointments, and more. To Log into MyChart, please go to [email or web address], and your Username is:  [email or web address]

## 2012-02-07 NOTE — Assessment & Plan Note (Signed)
Mild to mod, for antibx course,  to f/u any worsening symptoms or concerns,  Doubt pna, will hold on cxr

## 2012-02-07 NOTE — Progress Notes (Signed)
Subjective:    Patient ID: Shane Fischer, male    DOB: 1940-10-11, 71 y.o.   MRN: [id number]  HPI Here with acute onset mild to mod 2-3 days ST, HA, general weakness and malaise, with prod cough greenish sputum, but Pt denies chest pain, increased sob or doe, wheezing, orthopnea, PND, increased LE swelling, palpitations, dizziness or syncope.  Pt denies new neurological symptoms such as new headache, or facial or extremity weakness or numbness   Pt denies polydipsia, polyuria, or low sugar symptoms such as weakness or confusion improved with po intake.  Pt states overall good compliance with meds.  Cont's to try following lower chol diet Past Medical History  Diagnosis Date  . HYPERLIPIDEMIA 06/07/2007  . OTITIS MEDIA, ACUTE, RIGHT 02/04/2009  . SINUSITIS- ACUTE-NOS 06/07/2007  . ALLERGIC RHINITIS 06/07/2007  . BENIGN PROSTATIC HYPERTROPHY 06/07/2007  . CELLULITIS/ABSCESS, TRUNK 07/27/2009  . ELEVATED BLOOD PRESSURE WITHOUT DIAGNOSIS OF HYPERTENSION 02/04/2009  . Chronic sinusitis 06/23/2010  . Erectile dysfunction 06/23/2010   Past Surgical History  Procedure Date  . Inguinal herniorrhapy     right and left  . S/p left shoulder lipoma   . Melanoma excision 2005    right arm    reports that he has quit smoking. He has never used smokeless tobacco. He reports that he drinks about 3 ounces of alcohol per week. He reports that he does not use illicit drugs. family history includes Cancer in his mother; Colon cancer (age of onset:57) in his mother; and Heart attack in his other.  There is no history of Stomach cancer. Allergies  Allergen Reactions  . Amoxicillin-Pot Clavulanate     REACTION: rash   Current Outpatient Prescriptions on File Prior to Visit  Medication Sig Dispense Refill  . montelukast (SINGULAIR) 10 MG tablet Take 1 tablet (10 mg total) by mouth daily.  90 tablet  3  . vardenafil (LEVITRA) 20 MG tablet Take 1 tablet (20 mg total) by mouth daily as needed.  10 tablet  1  .  fluticasone (FLONASE) 50 MCG/ACT nasal spray 2 sprays by Nasal route daily.  16 g  2  . MOVIPREP 100 G SOLR moviprep as directed.  No substitutions  1 kit  0    Review of Systems  Constitutional: Negative for diaphoresis and unexpected weight change.  HENT: Negative for tinnitus.   Eyes: Negative for photophobia and visual disturbance.  Respiratory: Negative for choking and stridor.   Gastrointestinal: Negative for vomiting and blood in stool.  Genitourinary: Negative for hematuria and decreased urine volume.  Musculoskeletal: Negative for gait problem.  Skin: Negative for color change and wound.  Neurological: Negative for tremors and numbness.  Psychiatric/Behavioral: Negative for decreased concentration. The patient is not hyperactive.       Objective:   Physical Exam BP 120/80  Pulse 59  Temp 98 F (36.7 C) (Oral)  Ht 6' (1.829 m)  Wt 193 lb 6 oz (87.714 kg)  BMI 26.23 kg/m2  SpO2 95% Physical Exam  VS noted, mild ill Constitutional: Pt appears well-developed and well-nourished.  HENT: Head: Normocephalic.  Right Ear: External ear normal.  Left Ear: External ear normal.  Bilat tm's mild erythema.  Sinus nontender.  Pharynx mild erythema Eyes: Conjunctivae and EOM are normal. Pupils are equal, round, and reactive to light.  Neck: Normal range of motion. Neck supple.  Cardiovascular: Normal rate and regular rhythm.   Pulmonary/Chest: Effort normal and breath sounds normal.  Neurological: Pt is alert. Not confused  Skin: Skin is warm. No erythema.  Psychiatric: Pt behavior is normal. Thought content normal.     Assessment & Plan:

## 2012-02-07 NOTE — Assessment & Plan Note (Signed)
stable overall by hx and exam, most recent data reviewed with pt, and pt to continue medical treatment as before, low chol diet Lab Results  Component Value Date   LDLCALC 100* 06/23/2010

## 2012-02-07 NOTE — Assessment & Plan Note (Signed)
stable overall by hx and exam, most recent data reviewed with pt, and pt to continue medical treatment as before ble Lab Results  Component Value Date   WBC 5.4 09/12/2011   HGB 15.0 09/12/2011   HCT 45.4 09/12/2011   PLT 254.0 09/12/2011   GLUCOSE 100* 09/12/2011   CHOL 231* 09/12/2011   TRIG 38.0 09/12/2011   HDL 77.90 09/12/2011   LDLDIRECT 132.6 09/12/2011   LDLCALC 100* 06/23/2010   ALT 19 09/12/2011   AST 20 09/12/2011   NA 139 09/12/2011   K 4.2 09/12/2011   CL 100 09/12/2011   CREATININE 0.8 09/12/2011   BUN 15 09/12/2011   CO2 31 09/12/2011   TSH 1.23 09/12/2011   PSA 0.78 09/12/2011

## 2012-03-20 ENCOUNTER — Encounter: Payer: Self-pay | Admitting: Internal Medicine

## 2012-03-20 ENCOUNTER — Ambulatory Visit (INDEPENDENT_AMBULATORY_CARE_PROVIDER_SITE_OTHER): Payer: BC Managed Care – PPO | Admitting: Internal Medicine

## 2012-03-20 VITALS — BP 122/76 | HR 65 | Temp 98.2°F | Ht 72.0 in | Wt 195.2 lb

## 2012-03-20 DIAGNOSIS — M25459 Effusion, unspecified hip: Secondary | ICD-10-CM

## 2012-03-20 DIAGNOSIS — N529 Male erectile dysfunction, unspecified: Secondary | ICD-10-CM

## 2012-03-20 MED ORDER — VARDENAFIL HCL 20 MG PO TABS: 20.0000 mg | ORAL_TABLET | Freq: Every day | ORAL | Status: DC | PRN

## 2012-03-20 NOTE — Patient Instructions (Signed)
Venous Thromboembolism Venous thromboembolism (VTE) is a condition where a blood clot (thrombus) develops in the body. A thrombus usually occurs in a deep vein in the leg or pelvis but can occur in an upper extremity. Sometimes pieces of the thrombus can break off from its original place of development and travel through the bloodstream to other parts of the body. When a thrombus breaks off and travels through the bloodstream, it is called an embolism. The embolism can block the blood flow in the blood vessels of other organs. There are two two serious types of VTE:  Deep vein thrombosis (DVT). A DVT is a thrombus that usually occurs in a deep vein of the lower legs, pelvis, or in an upper extremity.  Pulmonary embolism (PE). A PE occurs when an embolism has formed and traveled to the lungs. A PE can block or decrease the blood flow in one or both lungs.  Venous thromboembolism is a serious health condition that can cause disability or death. It is very important to not ignore symptoms or delay treatment.  CAUSES   A blood clot can form in a vein from different conditions. A blood clot can develop due to:  Blood flow within a vein that is sluggish or very slow.  Medical conditions that make the blood clot easily.  Vein damage. RISK FACTORS Risk factors can increase your risk of developing a blood clot. Risk factors can include:  Smoking.  Obesity.  Age.  Immobility or sedentary lifestyle.  Sitting or standing for long periods of time.  Chronic or long-term bedrest.  Medical or past history of blood clots.  Family history of blood clots.  Hip, leg, or pelvis injury or trauma.  Major surgery, especially surgery on the hip, knee, or abdomen.  Pregnancy and childbirth.  Birth control pills and hormone replacement therapy.  Medical conditions such as  Peripheral vascular disease (PVD).  Diabetes.  Cancer. SYMPTOMS  Symptoms of VTE can depend on where the clot is located  and if the clot breaks off and travels to another organ. Sometimes, there may be no symptoms.   DVT symptoms can include:  Swelling of the leg or arm, especially on one side.  Warmth and redness of the leg or arm, especially on one side.  Pain in an arm or leg. Leg pain may be more noticeable or worse when standing or walking.  PE symptoms can include:  Shortness of breath.  Coughing.  Coughing up blood or blood-tinged mucus (hemoptysis).  Chest pain or chest pain with deep breaths (pleuritic chest pain).  Apprehension, anxiety, or a feeling of impending doom.  Rapid heartbeat. A PE is a medical emergency. Call your local emergency services (911 in [place] if you have these symptoms. DIAGNOSIS  A venous thromboembolism is diagnosed by:  Looking at your medical history and risk factors. Your caregiver will perform a physical exam.  Blood tests, including blood work of how your blood clots.  Imaging tests that can detect a blood clot may be ordered. These can include:  Ultrasonography.  Computed Tomography (CT) scan.  Magnetic Resonance Imaging (MRI).  Echocardiography.  Electrocardiography. TREATMENT  Initial treatment: When a venous thromboembolism has been confirmed, initial treatment consists of using blood thinning (anticoagulant) medicines. Anticoagulant medicines affect how your blood clots and can cause bleeding. Therefore, your blood clotting times are monitored by blood tests called prothrombin time (PT) and International Normalized Ratio (INR) when you are on anticoagulants. Typically, the anticoagulants are intravenous (IV) heparin and   warfarin. IV heparin is normally started right away because IV heparin has a rapid onset of action and thins the blood quickly. Warfarin is also started with IV heparin therapy. Warfarin has a slower onset of action and takes longer to work. This overlap therapy of IV heparin and oral warfarin is continued until PT and INR levels are  therapeutic. After the PT and INR levels are therapeutic, IV heparin is discontinued and you are maintained on warfarin.  Other treatment options:  Catheter-directed thrombolysis. This is a clot-busting therapy for a DVT in which a small, flexible hollow tube (catheter) is threaded to the blood clot inside the vein. A clot-busting drug (thrombolytic) is then infused through the catheter. The thrombolytic helps to break up the clot in the vein and restore blood flow.  Direct thrombin inhibitor (DTI) medicine. A DTI is an anticoagulant that slows blood clotting. It is given through an IV.  If you cannot take an anticoagulant, a filter called an inferior vena cava filter (IVC filter) can be placed. The IVC filter is placed in a large vein, in either your leg or abdomen. An IVC filter is left in the vein permanently. The IVC filter can help prevent blood clots from going to your lungs.  Surgery. Blood clots may need to be removed surgically if other treatment options are not working or cannot be used. Types of surgery can include:  Thrombectomy.  Embolectomy.  Venous stenting.  Pain medicine (analgesic). Medicine to control pain is given in addition to the above treatment options. Home treatment:  Continued treatment at home consists of taking either warfarin or under-the-skin (subcutaneous) injections of an anticoagulant. PREVENTION   In-hospital prevention:  Activity. Getting out of bed and walking while you are in the hospital can help prevent blood clots.  Medicines may be given to help prevent blood clots.  Sequential compression device (SCD). A SCD can help prevent blood clots in the lower legs. A compression sleeve is wrapped around each of your legs. The tubing of the sleeve is connected to a machine that pumps air into the compression sleeve. The pumping action of the sleeve helps circulate the blood in your legs.  Compression stockings. Compression stockings are tight, elastic  stockings that apply pressure to the lower legs and help prevent blood from pooling in the lower legs. Compression stockings are sometimes used with SCDs.  General prevention:  Exercise regularly if you are able.  Avoid sitting or lying in bed for long periods of time without moving the legs.  Do not smoke. If you smoke, quit. Ask your caregiver for help.  Avoid exposure to smoke.  Maintain a healthy weight.  Women over the age of 35 should consider the risk of blood clots while taking birth control pills or hormone replacement therapy.  Long distance travel along with prolonged sitting and standing can increase the risk of a DVT. Exercise your legs by walking or by pumping your leg muscles every hour. HOME CARE INSTRUCTIONS   Take all medicines prescribed by your caregiver. Follow the directions carefully.  Warfarin. Most people will continue taking warfarin. Your caregiver will advise you on the length of treatment (usually 3 to 6 months, sometimes lifelong).  Too much and too little warfarin are both dangerous. Too much warfarin increases the risk of bleeding. Too little warfarin continues to allow the risk for blood clots. While taking warfarin, you will need to have regular blood tests to measure your blood clotting time. These blood tests usually   include both the PT and INR tests. The PT and INR results allow your caregiver to adjust your dose of warfarin. The dose can change for many reasons. It is critically important that you take warfarin exactly as prescribed, and that you have your PT and INR levels drawn exactly as directed.  Many foods, especially foods high in vitamin K can interfere with warfarin and affect the PT and INR results. Foods high in vitamin K include spinach, kale, broccoli, cabbage, collard and turnip greens, brussels sprouts, peas, cauliflower, seaweed, and parsley as well as beef and pork liver, green tea, and soybean oil. You should eat a consistent amount of  foods high in vitamin K. Avoid major changes in your diet, or notify your caregiver before changing your diet. Arrange a visit with a dietitian to answer your questions.  Many medicines can interfere with warfarin and affect the PT and INR results. You must tell your caregiver about any and all medicines you take, this includes all vitamins and supplements. Be especially cautious with aspirin and anti-inflammatory medicines. Do not take or discontinue any prescribed or over-the-counter medicine except on the advice of your caregiver or pharmacist.  Warfarin can have side effects, such as excessive bruising or bleeding. You will need to hold pressure over cuts for longer than usual. Your caregiver or pharmacist will discuss other potential side effects.  Avoid sports or activities that may cause injury or bleeding.  Be mindful when shaving, flossing your teeth, or handling sharp objects.  Alcohol can change the body's ability to handle warfarin. It is best to avoid alcoholic drinks or consume only very small amounts while taking warfarin. Notify your caregiver if you change your alcohol intake.  Notify your dentist or other caregivers before procedures.  Activity. Ask your caregiver how soon you can go back to normal activities if you have had a blood blot.  Exercise. It is very important to exercise and stay active to prevent future blood clots. This is especially important while traveling, sitting, or standing for long periods of time. Exercise your legs by walking or by pumping the muscles frequently.  Compression stockings. You may need to wear compression stockings to help prevent a DVT.  Smoking. If you smoke, quit. Ask your caregiver for help with quitting smoking.  Learn as much as you can about VTE. Educating yourself can help prevent VTE from reoccurring.  Wear a medical alert bracelet or carry a medical alert card. SEEK MEDICAL CARE IF:   You feel faint or dizzy.  You feel  rapid or skipped heartbeats.  You feel weaker or more tired than usual.  You feel you are not getting better in the time expected.  You believe you are having side effects from medicine.  You have joint pain.  You have abdominal pain.  You have new or increased bruising. SEEK IMMEDIATE MEDICAL CARE IF:   You vomit bright red blood or your vomit has a coffee ground type appearance.  You have bowel movements that have bright red blood or are dark or tarry in appearance.  You have bleeding from your rectum.  You have pink or bloody urine.  You develop breathing problems such as shortness of breath or pain with breathing.  You are coughing up blood.  You develop warmth, swelling, or redness in an arm or a leg.  You have chest pain.  You have a sudden, unexplained severe headache.  You have a cut that does not stop bleeding after 10 minutes.    You have a nosebleed that does not stop bleeding after 10 minutes. Document Released: 12/18/2008 Document Revised: 08/22/2011 Document Reviewed: 08/02/2011 [employer] Patient Information 2013 [employer].  

## 2012-03-20 NOTE — Progress Notes (Signed)
Subjective:    Patient ID: Shane Fischer, male    DOB: 1940/12/02, 72 y.o.   MRN: [id number]  HPI  Pt presents to the clinic today with c/o of a painful lump in his thigh. He noticed this yesterday. He did see swelling that was tender to touch but no redness. He has never had anything like this before. He has not put anything on it. He does not smoke. He has not traveled on a plane or a long car ride recently. Additionally, pt request refill of Levitra today. He has had a history of ED. He does have an appointment with a urologist coming up in March but he was wondering if we could refill it until then.  Review of Systems      Past Medical History  Diagnosis Date  . HYPERLIPIDEMIA 06/07/2007  . OTITIS MEDIA, ACUTE, RIGHT 02/04/2009  . SINUSITIS- ACUTE-NOS 06/07/2007  . ALLERGIC RHINITIS 06/07/2007  . BENIGN PROSTATIC HYPERTROPHY 06/07/2007  . CELLULITIS/ABSCESS, TRUNK 07/27/2009  . ELEVATED BLOOD PRESSURE WITHOUT DIAGNOSIS OF HYPERTENSION 02/04/2009  . Chronic sinusitis 06/23/2010  . Erectile dysfunction 06/23/2010    Current Outpatient Prescriptions  Medication Sig Dispense Refill  . fluticasone (FLONASE) 50 MCG/ACT nasal spray 2 sprays by Nasal route daily.  16 g  2  . HYDROcodone-homatropine (HYCODAN) 5-1.5 MG/5ML syrup Take 5 mLs by mouth every 6 (six) hours as needed for cough.  120 mL  1  . levofloxacin (LEVAQUIN) 250 MG tablet Take 1 tablet (250 mg total) by mouth daily.  10 tablet  0  . montelukast (SINGULAIR) 10 MG tablet Take 1 tablet (10 mg total) by mouth daily.  90 tablet  3  . MOVIPREP 100 G SOLR moviprep as directed.  No substitutions  1 kit  0  . vardenafil (LEVITRA) 20 MG tablet Take 1 tablet (20 mg total) by mouth daily as needed.  10 tablet  1    Allergies  Allergen Reactions  . Amoxicillin-Pot Clavulanate     REACTION: rash    Family History  Problem Relation Age of Onset  . Cancer Mother     colon  . Colon cancer Mother 4  . Heart attack Other   . Stomach  cancer Neg Hx     History   Social History  . Marital Status: Married    Spouse Name: N/A    Number of Children: 3  . Years of Education: N/A   Occupational History  . semi-retired now pastor    Social History Main Topics  . Smoking status: Former Games developer  . Smokeless tobacco: Never Used  . Alcohol Use: 3.0 oz/week    5 Glasses of wine per week  . Drug Use: No  . Sexually Active: Not on file   Other Topics Concern  . Not on file   Social History Narrative  . No narrative on file     Constitutional: Denies fever, malaise, fatigue, headache or abrupt weight changes.  Musculoskeletal: Pt reports painful lump in right thigh. Denies decrease in range of motion, difficulty with gait, muscle pain or joint pain and swelling.  Skin: Denies redness, rashes, lesions or ulcercations.  Neurological: Denies dizziness, difficulty with memory, difficulty with speech or problems with balance and coordination.   No other specific complaints in a complete review of systems (except as listed in HPI above).  Objective:   Physical Exam   BP 122/76  Pulse 65  Temp 98.2 F (36.8 C) (Oral)  Ht 6' (1.829 m)  Wt 195 lb 3.2 oz (88.542 kg)  BMI 26.47 kg/m2  SpO2 97% Wt Readings from Last 3 Encounters:  03/20/12 195 lb 3.2 oz (88.542 kg)  02/07/12 193 lb 6 oz (87.714 kg)  10/18/11 189 lb (85.73 kg)    General: Appears his stated age, well developed, well nourished in NAD. Skin: Warm, dry and intact. No rashes, lesions or ulcerations noted. Cardiovascular: Normal rate and rhythm. S1,S2 noted.  No murmur, rubs or gallops noted. No JVD or BLE edema. No carotid bruits noted. Pulmonary/Chest: Normal effort and positive vesicular breath sounds. No respiratory distress. No wheezes, rales or ronchi noted.  Musculoskeletal: Normal range of motion. Small amount of swelling to right anterior thigh. No difficulty with gait.  Neurological: Alert and oriented. Cranial nerves II-XII intact.  Coordination normal. +DTRs bilaterally.       Assessment & Plan:   Right Lower Thigh Swelling, new onset with additional workup required:  Will obtain US right leg to rule out superficial thrombus Take a baby aspirin for the next week Avoid exercising or placing pressure on the are until after we get the results of the ultrasound  Erectile Dysfunction:  Refill Levitra today  RTC as needed or if symptoms persist

## 2012-03-20 NOTE — Addendum Note (Signed)
Addended by: Lorre Munroe on: 03/20/2012 02:59 PM   Modules accepted: Orders

## 2012-03-22 ENCOUNTER — Encounter (INDEPENDENT_AMBULATORY_CARE_PROVIDER_SITE_OTHER): Payer: BC Managed Care – PPO

## 2012-03-22 DIAGNOSIS — M25459 Effusion, unspecified hip: Secondary | ICD-10-CM

## 2012-03-22 DIAGNOSIS — R229 Localized swelling, mass and lump, unspecified: Secondary | ICD-10-CM

## 2012-05-16 ENCOUNTER — Encounter: Payer: BC Managed Care – PPO | Admitting: Internal Medicine

## 2012-06-25 ENCOUNTER — Telehealth: Payer: Self-pay

## 2012-06-25 ENCOUNTER — Other Ambulatory Visit (INDEPENDENT_AMBULATORY_CARE_PROVIDER_SITE_OTHER): Payer: BC Managed Care – PPO

## 2012-06-25 DIAGNOSIS — Z0000 Encounter for general adult medical examination without abnormal findings: Secondary | ICD-10-CM

## 2012-06-25 LAB — HEPATIC FUNCTION PANEL
ALT: 20 U/L (ref 0–53)
Albumin: 3.9 g/dL (ref 3.5–5.2)
Total Protein: 6.3 g/dL (ref 6.0–8.3)

## 2012-06-25 LAB — URINALYSIS, ROUTINE W REFLEX MICROSCOPIC
Bilirubin Urine: NEGATIVE
Leukocytes, UA: NEGATIVE
Nitrite: NEGATIVE
Total Protein, Urine: NEGATIVE
Urobilinogen, UA: 0.2 (ref 0.0–1.0)

## 2012-06-25 LAB — BASIC METABOLIC PANEL
BUN: 17 mg/dL (ref 6–23)
CO2: 28 mEq/L (ref 19–32)
Calcium: 8.9 mg/dL (ref 8.4–10.5)
Chloride: 102 mEq/L (ref 96–112)
Creatinine, Ser: 0.7 mg/dL (ref 0.4–1.5)

## 2012-06-25 LAB — LIPID PANEL
LDL Cholesterol: 126 mg/dL — ABNORMAL HIGH (ref 0–99)
Total CHOL/HDL Ratio: 3
Triglycerides: 33 mg/dL (ref 0.0–149.0)

## 2012-06-25 LAB — TSH: TSH: 0.84 u[IU]/mL (ref 0.35–5.50)

## 2012-06-25 LAB — CBC WITH DIFFERENTIAL/PLATELET
Basophils Relative: 0.6 % (ref 0.0–3.0)
HCT: 45.7 % (ref 39.0–52.0)
Hemoglobin: 15.3 g/dL (ref 13.0–17.0)
Lymphocytes Relative: 28.3 % (ref 12.0–46.0)
Lymphs Abs: 1.2 10*3/uL (ref 0.7–4.0)
MCHC: 33.5 g/dL (ref 30.0–36.0)
Monocytes Relative: 11.2 % (ref 3.0–12.0)
Neutro Abs: 2.5 10*3/uL (ref 1.4–7.7)
RBC: 4.8 Mil/uL (ref 4.22–5.81)

## 2012-06-25 LAB — PSA: PSA: 0.42 ng/mL (ref 0.10–4.00)

## 2012-06-25 NOTE — Telephone Encounter (Signed)
Labs entered.

## 2012-06-27 ENCOUNTER — Encounter: Payer: Self-pay | Admitting: Internal Medicine

## 2012-06-27 ENCOUNTER — Ambulatory Visit (INDEPENDENT_AMBULATORY_CARE_PROVIDER_SITE_OTHER): Payer: BC Managed Care – PPO | Admitting: Internal Medicine

## 2012-06-27 VITALS — BP 110/70 | HR 59 | Temp 97.8°F | Ht 72.0 in | Wt 193.1 lb

## 2012-06-27 DIAGNOSIS — J309 Allergic rhinitis, unspecified: Secondary | ICD-10-CM

## 2012-06-27 DIAGNOSIS — N529 Male erectile dysfunction, unspecified: Secondary | ICD-10-CM

## 2012-06-27 DIAGNOSIS — Z0000 Encounter for general adult medical examination without abnormal findings: Secondary | ICD-10-CM

## 2012-06-27 MED ORDER — MONTELUKAST SODIUM 10 MG PO TABS: 10.0000 mg | ORAL_TABLET | Freq: Every day | ORAL | Status: DC

## 2012-06-27 MED ORDER — VARDENAFIL HCL 20 MG PO TABS: 20.0000 mg | ORAL_TABLET | Freq: Every day | ORAL | Status: DC | PRN

## 2012-06-27 NOTE — Assessment & Plan Note (Signed)

## 2012-06-27 NOTE — Progress Notes (Signed)
Subjective:    Patient ID: Shane Fischer, male    DOB: 1940/03/07, 72 y.o.   MRN: [id number]    HPI Here for wellness and f/u;  Overall doing ok;  Pt denies CP, worsening SOB, DOE, wheezing, orthopnea, PND, worsening LE edema, palpitations, dizziness or syncope.  Pt denies neurological change such as new headache, facial or extremity weakness.  Pt denies polydipsia, polyuria, or low sugar symptoms. Pt states overall good compliance with treatment and medications, good tolerability, and has been trying to follow lower cholesterol diet.  Pt denies worsening depressive symptoms, suicidal ideation or panic. No fever, night sweats, wt loss, loss of appetite, or other constitutional symptoms.  Pt states good ability with ADL's, has low fall risk, home safety reviewed and adequate, no other significant changes in hearing or vision, and only occasionally active with exercise.  Has had mild nasal allergy symtpoms recent Past Medical History  Diagnosis Date  . HYPERLIPIDEMIA 06/07/2007  . OTITIS MEDIA, ACUTE, RIGHT 02/04/2009  . SINUSITIS- ACUTE-NOS 06/07/2007  . ALLERGIC RHINITIS 06/07/2007  . BENIGN PROSTATIC HYPERTROPHY 06/07/2007  . CELLULITIS/ABSCESS, TRUNK 07/27/2009  . ELEVATED BLOOD PRESSURE WITHOUT DIAGNOSIS OF HYPERTENSION 02/04/2009  . Chronic sinusitis 06/23/2010  . Erectile dysfunction 06/23/2010   Past Surgical History  Procedure Laterality Date  . Inguinal herniorrhapy      right and left  . S/p left shoulder lipoma    . Melanoma excision  2005    right arm    reports that he has quit smoking. He has never used smokeless tobacco. He reports that he drinks about 3.0 ounces of alcohol per week. He reports that he does not use illicit drugs. family history includes Cancer in his mother; Colon cancer (age of onset: 64) in his mother; and Heart attack in his other.  There is no history of Stomach cancer. Allergies  Allergen Reactions  . Amoxicillin-Pot Clavulanate     REACTION: rash   No current  outpatient prescriptions on file prior to visit.   No current facility-administered medications on file prior to visit.   Review of Systems Constitutional: Negative for diaphoresis, activity change, appetite change or unexpected weight change.  HENT: Negative for hearing loss, ear pain, facial swelling, mouth sores and neck stiffness.   Eyes: Negative for pain, redness and visual disturbance.  Respiratory: Negative for shortness of breath and wheezing.   Cardiovascular: Negative for chest pain and palpitations.  Gastrointestinal: Negative for diarrhea, blood in stool, abdominal distention or other pain Genitourinary: Negative for hematuria, flank pain or change in urine volume.  Musculoskeletal: Negative for myalgias and joint swelling.  Skin: Negative for color change and wound.  Neurological: Negative for syncope and numbness. other than noted Hematological: Negative for adenopathy.  Psychiatric/Behavioral: Negative for hallucinations, self-injury, decreased concentration and agitation.      Objective:   Physical Exam BP 110/70  Pulse 59  Temp(Src) 97.8 F (36.6 C) (Oral)  Ht 6' (1.829 m)  Wt 193 lb 2 oz (87.601 kg)  BMI 26.19 kg/m2  SpO2 96% VS noted,  Constitutional: Pt is oriented to person, place, and time. Appears well-developed and well-nourished.  Head: Normocephalic and atraumatic.  Right Ear: External ear normal.  Left Ear: External ear normal.  Nose: Nose normal.  Mouth/Throat: Oropharynx is clear and moist.  Eyes: Conjunctivae and EOM are normal. Pupils are equal, round, and reactive to light.  Neck: Normal range of motion. Neck supple. No JVD present. No tracheal deviation present.  Cardiovascular: Normal rate, regular  rhythm, normal heart sounds and intact distal pulses.   Pulmonary/Chest: Effort normal and breath sounds normal.  Abdominal: Soft. Bowel sounds are normal. There is no tenderness. No HSM  Musculoskeletal: Normal range of motion. Exhibits no edema.   Lymphadenopathy:  Has no cervical adenopathy.  Neurological: Pt is alert and oriented to person, place, and time. Pt has normal reflexes. No cranial nerve deficit.  Skin: Skin is warm and dry. No rash noted.  Psychiatric:  Has  normal mood and affect. Behavior is normal.     Assessment & Plan:

## 2012-06-27 NOTE — Patient Instructions (Addendum)
Please continue all other medications as before, and refills have been done if requested Please continue your efforts at being more active, low cholesterol diet, and weight control. You are otherwise up to date with prevention measures today. You can also consider [facility] OTC for allergies and ear if needed Please keep your appointments with your specialists as you may have planned Good luck with the [employer]! Thank you for enrolling in MyChart. Please follow the instructions below to securely access your online medical record. MyChart allows you to send messages to your doctor, view your test results, renew your prescriptions, schedule appointments, and more. Please return in 1 year for your yearly visit, or sooner if needed, with Lab testing done 3-5 days before

## 2012-06-27 NOTE — Assessment & Plan Note (Signed)
Ok for [employer] prn

## 2012-09-13 ENCOUNTER — Other Ambulatory Visit: Payer: Self-pay | Admitting: Internal Medicine

## 2012-11-05 ENCOUNTER — Ambulatory Visit (INDEPENDENT_AMBULATORY_CARE_PROVIDER_SITE_OTHER): Payer: BC Managed Care – PPO | Admitting: Internal Medicine

## 2012-11-05 ENCOUNTER — Encounter: Payer: Self-pay | Admitting: Internal Medicine

## 2012-11-05 VITALS — BP 128/82 | HR 64 | Temp 98.4°F | Ht 72.0 in | Wt 191.4 lb

## 2012-11-05 DIAGNOSIS — Z23 Encounter for immunization: Secondary | ICD-10-CM

## 2012-11-05 DIAGNOSIS — N400 Enlarged prostate without lower urinary tract symptoms: Secondary | ICD-10-CM

## 2012-11-05 DIAGNOSIS — R319 Hematuria, unspecified: Secondary | ICD-10-CM

## 2012-11-05 NOTE — Progress Notes (Signed)
Subjective:    Patient ID: Shane Fischer, male    DOB: Mar 27, 1940, 72 y.o.   MRN: [id number]  HPI  Here for f/u, sees urology and derm in [place] close to home;  Last saw urology approx 1 mo with discomfort to testicles, felt somewhat like epididymitis he has 20 yrs ago, but urology dx and tx for prostatitis with 30 days antibx, finished 3 wks ago, felt fine after, but then noticed BRB spots on the underwear, felt again ? Due to prostate per pt report, tx with flomax, has not actually started this, and plan is for cysto next wk.  Pt concerned about side effect he reads with the information.   Past Medical History  Diagnosis Date  . HYPERLIPIDEMIA 06/07/2007  . OTITIS MEDIA, ACUTE, RIGHT 02/04/2009  . SINUSITIS- ACUTE-NOS 06/07/2007  . ALLERGIC RHINITIS 06/07/2007  . BENIGN PROSTATIC HYPERTROPHY 06/07/2007  . CELLULITIS/ABSCESS, TRUNK 07/27/2009  . ELEVATED BLOOD PRESSURE WITHOUT DIAGNOSIS OF HYPERTENSION 02/04/2009  . Chronic sinusitis 06/23/2010  . Erectile dysfunction 06/23/2010   Past Surgical History  Procedure Laterality Date  . Inguinal herniorrhapy      right and left  . S/p left shoulder lipoma    . Melanoma excision  2005    right arm    reports that he has quit smoking. He has never used smokeless tobacco. He reports that he drinks about 3.0 ounces of alcohol per week. He reports that he does not use illicit drugs. family history includes Cancer in his mother; Colon cancer (age of onset: 69) in his mother; Heart attack in his other. There is no history of Stomach cancer. Allergies  Allergen Reactions  . Amoxicillin-Pot Clavulanate     REACTION: rash   Current Outpatient Prescriptions on File Prior to Visit  Medication Sig Dispense Refill  . montelukast (SINGULAIR) 10 MG tablet TAKE 1 TABLET (10 MG TOTAL) BY MOUTH DAILY.  30 tablet  11  . vardenafil (LEVITRA) 20 MG tablet Take 1 tablet (20 mg total) by mouth daily as needed.  10 tablet  11   No current facility-administered  medications on file prior to visit.   Review of Systems  Constitutional: Negative for unexpected weight change, or unusual diaphoresis  HENT: Negative for tinnitus.   Eyes: Negative for photophobia and visual disturbance.  Respiratory: Negative for choking and stridor.   Gastrointestinal: Negative for vomiting and blood in stool.  Genitourinary: Negative for hematuria and decreased urine volume.  Musculoskeletal: Negative for acute joint swelling Skin: Negative for color change and wound.  Neurological: Negative for tremors and numbness other than noted  Psychiatric/Behavioral: Negative for decreased concentration or  hyperactivity.       Objective:   Physical Exam BP 128/82  Pulse 64  Temp(Src) 98.4 F (36.9 C) (Oral)  Ht 6' (1.829 m)  Wt 191 lb 6 oz (86.807 kg)  BMI 25.95 kg/m2  SpO2 97% VS noted,  Constitutional: Pt appears well-developed and well-nourished.  HENT: Head: NCAT.  Right Ear: External ear normal.  Left Ear: External ear normal.  Eyes: Conjunctivae and EOM are normal. Pupils are equal, round, and reactive to light.  Neck: Normal range of motion. Neck supple.  Cardiovascular: Normal rate and regular rhythm.   Pulmonary/Chest: Effort normal and breath sounds normal.  Abd:  Soft, NT, non-distended, + BS Neurological: Pt is alert. Not confused  Skin: Skin is warm. No erythema.  Psychiatric: Pt behavior is normal. Thought content normal.     Assessment & Plan:

## 2012-11-05 NOTE — Assessment & Plan Note (Signed)
Per pt hx also with recent prostatitis, ok to take the flomax, d/w pt common side effect and risk seems low

## 2012-11-05 NOTE — Patient Instructions (Addendum)
You had the flu shot today OK to take the flomax Please continue all other medications as before, and refills have been done if requested.  Please keep your appointments with your specialists as you have planned - urology for the cysto

## 2012-11-05 NOTE — Assessment & Plan Note (Signed)
Small blood drops, for cysto next wk per pt per urology

## 2013-07-12 ENCOUNTER — Other Ambulatory Visit: Payer: Self-pay | Admitting: Internal Medicine

## 2013-08-27 ENCOUNTER — Other Ambulatory Visit: Payer: Self-pay | Admitting: Internal Medicine

## 2013-08-27 DIAGNOSIS — Z0000 Encounter for general adult medical examination without abnormal findings: Secondary | ICD-10-CM

## 2013-09-19 ENCOUNTER — Other Ambulatory Visit: Payer: Self-pay | Admitting: Internal Medicine

## 2013-10-18 ENCOUNTER — Ambulatory Visit (INDEPENDENT_AMBULATORY_CARE_PROVIDER_SITE_OTHER): Payer: BC Managed Care – PPO | Admitting: Family Medicine

## 2013-10-18 ENCOUNTER — Encounter: Payer: Self-pay | Admitting: Family Medicine

## 2013-10-18 VITALS — BP 124/70 | Temp 98.4°F | Wt 189.0 lb

## 2013-10-18 DIAGNOSIS — J3089 Other allergic rhinitis: Secondary | ICD-10-CM

## 2013-10-18 NOTE — Progress Notes (Signed)
Pre visit review using our clinic review tool, if applicable. No additional management support is needed unless otherwise documented below in the visit note. 

## 2013-10-18 NOTE — Progress Notes (Signed)
   Subjective:    Patient ID: Shane Fischer, male    DOB: Aug 19, 1940, 73 y.o.   MRN: [id number]  Sinusitis Associated symptoms include congestion and sinus pressure. Pertinent negatives include no chills or coughing.   Patient seen Saturday clinic with sinus congestive symptoms. He has had about 2 weeks of postnasal drip symptoms. Frequent sinusitis in the past. His wife noted increased problems with bad breath over the past few days. Mild headache past couple days. Nasal discharge mostly clear. Only minimal cough. No sore throat. No laryngitis symptoms. Takes Singulair regularly.  Past Medical History  Diagnosis Date  . HYPERLIPIDEMIA 06/07/2007  . OTITIS MEDIA, ACUTE, RIGHT 02/04/2009  . SINUSITIS- ACUTE-NOS 06/07/2007  . ALLERGIC RHINITIS 06/07/2007  . BENIGN PROSTATIC HYPERTROPHY 06/07/2007  . CELLULITIS/ABSCESS, TRUNK 07/27/2009  . ELEVATED BLOOD PRESSURE WITHOUT DIAGNOSIS OF HYPERTENSION 02/04/2009  . Chronic sinusitis 06/23/2010  . Erectile dysfunction 06/23/2010   Past Surgical History  Procedure Laterality Date  . Inguinal herniorrhapy      right and left  . S/p left shoulder lipoma    . Melanoma excision  2005    right arm    reports that he has quit smoking. He has never used smokeless tobacco. He reports that he drinks about 3 ounces of alcohol per week. He reports that he does not use illicit drugs. family history includes Cancer in his mother; Colon cancer (age of onset: 53) in his mother; Heart attack in his other. There is no history of Stomach cancer. Allergies  Allergen Reactions  . Amoxicillin-Pot Clavulanate     REACTION: rash      Review of Systems  Constitutional: Negative for fever and chills.  HENT: Positive for congestion, postnasal drip, rhinorrhea and sinus pressure.   Respiratory: Negative for cough.        Objective:   Physical Exam  Constitutional: He appears well-developed and well-nourished.  HENT:  Right Ear: External ear normal.  Left Ear:  External ear normal.  Mouth/Throat: Oropharynx is clear and moist.  Neck: Neck supple.  Cardiovascular: Normal rate and regular rhythm.   Pulmonary/Chest: Effort normal and breath sounds normal. No respiratory distress. He has no wheezes. He has no rales.  Lymphadenopathy:    He has no cervical adenopathy.          Assessment & Plan:  Rhinitis. Suspect allergic. No evidence for bacterial process at this time.  Add over-the-counter Allegra, Zyrtec, or Claritin to his Singulair

## 2013-10-18 NOTE — Patient Instructions (Signed)
Allergic Rhinitis Allergic rhinitis is when the mucous membranes in the nose respond to allergens. Allergens are particles in the air that cause your body to have an allergic reaction. This causes you to release allergic antibodies. Through a chain of events, these eventually cause you to release histamine into the blood stream. Although meant to protect the body, it is this release of histamine that causes your discomfort, such as frequent sneezing, congestion, and an itchy, runny nose.  CAUSES  Seasonal allergic rhinitis (hay fever) is caused by pollen allergens that may come from grasses, trees, and weeds. Year-round allergic rhinitis (perennial allergic rhinitis) is caused by allergens such as house dust mites, pet dander, and mold spores.  SYMPTOMS   Nasal stuffiness (congestion).  Itchy, runny nose with sneezing and tearing of the eyes. DIAGNOSIS  Your health care provider can help you determine the allergen or allergens that trigger your symptoms. If you and your health care provider are unable to determine the allergen, skin or blood testing may be used. TREATMENT  Allergic rhinitis does not have a cure, but it can be controlled by:  Medicines and allergy shots (immunotherapy).  Avoiding the allergen. Hay fever may often be treated with antihistamines in pill or nasal spray forms. Antihistamines block the effects of histamine. There are over-the-counter medicines that may help with nasal congestion and swelling around the eyes. Check with your health care provider before taking or giving this medicine.  If avoiding the allergen or the medicine prescribed do not work, there are many new medicines your health care provider can prescribe. Stronger medicine may be used if initial measures are ineffective. Desensitizing injections can be used if medicine and avoidance does not work. Desensitization is when a patient is given ongoing shots until the body becomes less sensitive to the allergen.  Make sure you follow up with your health care provider if problems continue. HOME CARE INSTRUCTIONS It is not possible to completely avoid allergens, but you can reduce your symptoms by taking steps to limit your exposure to them. It helps to know exactly what you are allergic to so that you can avoid your specific triggers. SEEK MEDICAL CARE IF:   You have a fever.  You develop a cough that does not stop easily (persistent).  You have shortness of breath.  You start wheezing.  Symptoms interfere with normal daily activities. Document Released: 11/15/2000 Document Revised: 02/25/2013 Document Reviewed: 10/28/2012 [facility] Patient Information 2015 [place], [place]. This information is not intended to replace advice given to you by your health care provider. Make sure you discuss any questions you have with your health care provider.  Try over-the-counter Allegra, Zyrtec, or Claritin in addition to your Singulair.

## 2013-11-14 ENCOUNTER — Other Ambulatory Visit (INDEPENDENT_AMBULATORY_CARE_PROVIDER_SITE_OTHER): Payer: BC Managed Care – PPO

## 2013-11-14 ENCOUNTER — Telehealth: Payer: Self-pay

## 2013-11-14 DIAGNOSIS — Z0000 Encounter for general adult medical examination without abnormal findings: Secondary | ICD-10-CM

## 2013-11-14 LAB — CBC WITH DIFFERENTIAL/PLATELET
BASOS ABS: 0 10*3/uL (ref 0.0–0.1)
Basophils Relative: 0.6 % (ref 0.0–3.0)
EOS ABS: 0.1 10*3/uL (ref 0.0–0.7)
Eosinophils Relative: 1 % (ref 0.0–5.0)
HCT: 46.1 % (ref 39.0–52.0)
Hemoglobin: 15.4 g/dL (ref 13.0–17.0)
LYMPHS PCT: 25.9 % (ref 12.0–46.0)
Lymphs Abs: 1.4 10*3/uL (ref 0.7–4.0)
MCHC: 33.4 g/dL (ref 30.0–36.0)
MCV: 97 fl (ref 78.0–100.0)
MONO ABS: 0.5 10*3/uL (ref 0.1–1.0)
Monocytes Relative: 9.2 % (ref 3.0–12.0)
NEUTROS PCT: 63.3 % (ref 43.0–77.0)
Neutro Abs: 3.4 10*3/uL (ref 1.4–7.7)
PLATELETS: 285 10*3/uL (ref 150.0–400.0)
RBC: 4.76 Mil/uL (ref 4.22–5.81)
RDW: 14.6 % (ref 11.5–15.5)
WBC: 5.3 10*3/uL (ref 4.0–10.5)

## 2013-11-14 LAB — URINALYSIS, ROUTINE W REFLEX MICROSCOPIC
Bilirubin Urine: NEGATIVE
Hgb urine dipstick: NEGATIVE
Ketones, ur: NEGATIVE
Leukocytes, UA: NEGATIVE
Nitrite: NEGATIVE
Specific Gravity, Urine: 1.015 (ref 1.000–1.030)
Total Protein, Urine: NEGATIVE
Urine Glucose: NEGATIVE
Urobilinogen, UA: 0.2 (ref 0.0–1.0)
pH: 6.5 (ref 5.0–8.0)

## 2013-11-14 LAB — BASIC METABOLIC PANEL
BUN: 11 mg/dL (ref 6–23)
CO2: 30 mEq/L (ref 19–32)
Calcium: 9 mg/dL (ref 8.4–10.5)
Chloride: 102 mEq/L (ref 96–112)
Creatinine, Ser: 0.7 mg/dL (ref 0.4–1.5)
GFR: 124.3 mL/min (ref >60.00)
Glucose, Bld: 93 mg/dL (ref 70–99)
Potassium: 4.5 mEq/L (ref 3.5–5.1)
Sodium: 138 mEq/L (ref 135–145)

## 2013-11-14 LAB — HEPATIC FUNCTION PANEL
ALT: 21 U/L (ref 0–53)
AST: 22 U/L (ref 0–37)
Albumin: 3.9 g/dL (ref 3.5–5.2)
Alkaline Phosphatase: 38 U/L — ABNORMAL LOW (ref 39–117)
Bilirubin, Direct: 0.2 mg/dL (ref 0.0–0.3)
Total Bilirubin: 1.1 mg/dL (ref 0.2–1.2)
Total Protein: 6.6 g/dL (ref 6.0–8.3)

## 2013-11-14 LAB — LIPID PANEL
Cholesterol: 192 mg/dL (ref 0–200)
HDL: 61.5 mg/dL (ref >39.00)
LDL Cholesterol: 124 mg/dL — ABNORMAL HIGH (ref 0–99)
NonHDL: 130.5
Total CHOL/HDL Ratio: 3
Triglycerides: 33 mg/dL (ref 0.0–149.0)
VLDL: 6.6 mg/dL (ref 0.0–40.0)

## 2013-11-14 LAB — TSH: TSH: 0.94 u[IU]/mL (ref 0.35–4.50)

## 2013-11-14 LAB — PSA: PSA: 0.45 ng/mL (ref 0.10–4.00)

## 2013-11-14 NOTE — Telephone Encounter (Signed)
Labs entered.

## 2013-11-18 ENCOUNTER — Ambulatory Visit (INDEPENDENT_AMBULATORY_CARE_PROVIDER_SITE_OTHER): Payer: BC Managed Care – PPO | Admitting: Internal Medicine

## 2013-11-18 ENCOUNTER — Encounter: Payer: Self-pay | Admitting: Internal Medicine

## 2013-11-18 VITALS — BP 120/70 | HR 63 | Temp 98.3°F | Ht 72.0 in | Wt 187.1 lb

## 2013-11-18 DIAGNOSIS — E785 Hyperlipidemia, unspecified: Secondary | ICD-10-CM

## 2013-11-18 DIAGNOSIS — J309 Allergic rhinitis, unspecified: Secondary | ICD-10-CM

## 2013-11-18 DIAGNOSIS — Z23 Encounter for immunization: Secondary | ICD-10-CM

## 2013-11-18 DIAGNOSIS — Z0000 Encounter for general adult medical examination without abnormal findings: Secondary | ICD-10-CM

## 2013-11-18 MED ORDER — FLUTICASONE PROPIONATE 50 MCG/ACT NA SUSP: 2.0000 | Freq: Every day | NASAL | Status: DC

## 2013-11-18 NOTE — Progress Notes (Signed)
Pre visit review using our clinic review tool, if applicable. No additional management support is needed unless otherwise documented below in the visit note. 

## 2013-11-18 NOTE — Progress Notes (Signed)
Subjective:    Patient ID: Shane Fischer, male    DOB: 1940/06/09, 73 y.o.   MRN: [id number]  HPI  Here for wellness and f/u;  Overall doing ok;  Pt denies CP, worsening SOB, DOE, wheezing, orthopnea, PND, worsening LE edema, palpitations, dizziness or syncope.  Pt denies neurological change such as new headache, facial or extremity weakness.  Pt denies polydipsia, polyuria, or low sugar symptoms. Pt states overall good compliance with treatment and medications, good tolerability, and has been trying to follow lower cholesterol diet.  Pt denies worsening depressive symptoms, suicidal ideation or panic. No fever, night sweats, wt loss, loss of appetite, or other constitutional symptoms.  Pt states good ability with ADL's, has low fall risk, home safety reviewed and adequate, no other significant changes in hearing or vision, and only occasionally active with exercise. Allergy symptoms from aug 2015 not really better with allegra and singulair combination.  New grandchild due soon, he asks for Tdap, and flu shot, as well as shingles.   Past Medical History  Diagnosis Date  . HYPERLIPIDEMIA 06/07/2007  . OTITIS MEDIA, ACUTE, RIGHT 02/04/2009  . SINUSITIS- ACUTE-NOS 06/07/2007  . ALLERGIC RHINITIS 06/07/2007  . BENIGN PROSTATIC HYPERTROPHY 06/07/2007  . CELLULITIS/ABSCESS, TRUNK 07/27/2009  . ELEVATED BLOOD PRESSURE WITHOUT DIAGNOSIS OF HYPERTENSION 02/04/2009  . Chronic sinusitis 06/23/2010  . Erectile dysfunction 06/23/2010   Past Surgical History  Procedure Laterality Date  . Inguinal herniorrhapy      right and left  . S/p left shoulder lipoma    . Melanoma excision  2005    right arm    reports that he has quit smoking. He has never used smokeless tobacco. He reports that he drinks about 3 ounces of alcohol per week. He reports that he does not use illicit drugs. family history includes Cancer in his mother; Colon cancer (age of onset: 45) in his mother; Heart attack in his other. There is no  history of Stomach cancer. Allergies  Allergen Reactions  . Amoxicillin-Pot Clavulanate     REACTION: rash   Current Outpatient Prescriptions on File Prior to Visit  Medication Sig Dispense Refill  . LEVITRA 20 MG tablet TAKE 1 TABLET (20 MG TOTAL) BY MOUTH DAILY AS NEEDED.  8 tablet  10  . montelukast (SINGULAIR) 10 MG tablet TAKE 1 TABLET (10 MG TOTAL) BY MOUTH DAILY.  30 tablet  3   No current facility-administered medications on file prior to visit.    Review of Systems Constitutional: Negative for increased diaphoresis, other activity, appetite or other siginficant weight change  HENT: Negative for worsening hearing loss, ear pain, facial swelling, mouth sores and neck stiffness.   Eyes: Negative for other worsening pain, redness or visual disturbance.  Respiratory: Negative for shortness of breath and wheezing.   Cardiovascular: Negative for chest pain and palpitations.  Gastrointestinal: Negative for diarrhea, blood in stool, abdominal distention or other pain Genitourinary: Negative for hematuria, flank pain or change in urine volume.  Musculoskeletal: Negative for myalgias or other joint complaints.  Skin: Negative for color change and wound.  Neurological: Negative for syncope and numbness. other than noted Hematological: Negative for adenopathy. or other swelling Psychiatric/Behavioral: Negative for hallucinations, self-injury, decreased concentration or other worsening agitation.      Objective:   Physical Exam BP 120/70  Pulse 63  Temp(Src) 98.3 F (36.8 C) (Oral)  Ht 6' (1.829 m)  Wt 187 lb 1.9 oz (84.877 kg)  BMI 25.37 kg/m2  SpO2 97% VS  noted,  Constitutional: Pt is oriented to person, place, and time. Appears well-developed and well-nourished.  Head: Normocephalic and atraumatic.  Right Ear: External ear normal.  Left Ear: External ear normal.  Nose: Nose normal.  Mouth/Throat: Oropharynx is clear and moist.  Eyes: Conjunctivae and EOM are normal. Pupils  are equal, round, and reactive to light.  Neck: Normal range of motion. Neck supple. No JVD present. No tracheal deviation present.  Cardiovascular: Normal rate, regular rhythm, normal heart sounds and intact distal pulses.   Pulmonary/Chest: Effort normal and breath sounds without rales or wheezing  Abdominal: Soft. Bowel sounds are normal. NT. No HSM  Musculoskeletal: Normal range of motion. Exhibits no edema.  Lymphadenopathy:  Has no cervical adenopathy.  Neurological: Pt is alert and oriented to person, place, and time. Pt has normal reflexes. No cranial nerve deficit. Motor grossly intact Skin: Skin is warm and dry. No rash noted.  Psychiatric:  Has normal mood and affect. Behavior is normal.     Assessment & Plan:

## 2013-11-18 NOTE — Assessment & Plan Note (Signed)

## 2013-11-18 NOTE — Patient Instructions (Addendum)
You had the Tdap, flu shot and shingles shot today  Please take all new medication as prescribed - the flonase  Please continue all other medications as before, and refills have been done if requested.  Please have the pharmacy call with any other refills you may need.  Please continue your efforts at being more active, low cholesterol diet, and weight control.  You are otherwise up to date with prevention measures today.  Please keep your appointments with your specialists as you may have planned  Your blood work was OK today  Please return in 1 year for your yearly visit, or sooner if needed, with Lab testing done 3-5 days before

## 2013-11-18 NOTE — Assessment & Plan Note (Signed)
Declines statin, for lower choldiet

## 2013-11-18 NOTE — Assessment & Plan Note (Signed)
Ok to add flonase sd

## 2014-08-31 ENCOUNTER — Other Ambulatory Visit: Payer: Self-pay

## 2014-09-17 ENCOUNTER — Encounter: Payer: Self-pay | Admitting: Gastroenterology

## 2014-11-17 ENCOUNTER — Other Ambulatory Visit (INDEPENDENT_AMBULATORY_CARE_PROVIDER_SITE_OTHER): Payer: Self-pay

## 2014-11-17 DIAGNOSIS — Z0000 Encounter for general adult medical examination without abnormal findings: Secondary | ICD-10-CM

## 2014-11-17 LAB — LIPID PANEL
CHOLESTEROL: 175 mg/dL (ref 0–200)
HDL: 58.7 mg/dL (ref >39.00)
LDL Cholesterol: 108 mg/dL — ABNORMAL HIGH (ref 0–99)
NonHDL: 116
TRIGLYCERIDES: 42 mg/dL (ref 0.0–149.0)
Total CHOL/HDL Ratio: 3
VLDL: 8.4 mg/dL (ref 0.0–40.0)

## 2014-11-17 LAB — HEPATIC FUNCTION PANEL
ALBUMIN: 3.9 g/dL (ref 3.5–5.2)
ALK PHOS: 40 U/L (ref 39–117)
ALT: 17 U/L (ref 0–53)
AST: 18 U/L (ref 0–37)
Bilirubin, Direct: 0.2 mg/dL (ref 0.0–0.3)
TOTAL PROTEIN: 6.3 g/dL (ref 6.0–8.3)
Total Bilirubin: 1 mg/dL (ref 0.2–1.2)

## 2014-11-17 LAB — CBC WITH DIFFERENTIAL/PLATELET
BASOS ABS: 0 10*3/uL (ref 0.0–0.1)
Basophils Relative: 0.5 % (ref 0.0–3.0)
Eosinophils Absolute: 0.1 10*3/uL (ref 0.0–0.7)
Eosinophils Relative: 1.4 % (ref 0.0–5.0)
HCT: 45.5 % (ref 39.0–52.0)
Hemoglobin: 15.4 g/dL (ref 13.0–17.0)
LYMPHS ABS: 1.3 10*3/uL (ref 0.7–4.0)
Lymphocytes Relative: 24.6 % (ref 12.0–46.0)
MCHC: 33.7 g/dL (ref 30.0–36.0)
MCV: 95 fl (ref 78.0–100.0)
Monocytes Absolute: 0.4 10*3/uL (ref 0.1–1.0)
Monocytes Relative: 8 % (ref 3.0–12.0)
NEUTROS PCT: 65.5 % (ref 43.0–77.0)
Neutro Abs: 3.4 10*3/uL (ref 1.4–7.7)
Platelets: 288 10*3/uL (ref 150.0–400.0)
RBC: 4.79 Mil/uL (ref 4.22–5.81)
RDW: 15.1 % (ref 11.5–15.5)
WBC: 5.2 10*3/uL (ref 4.0–10.5)

## 2014-11-17 LAB — URINALYSIS, ROUTINE W REFLEX MICROSCOPIC
BILIRUBIN URINE: NEGATIVE
KETONES UR: NEGATIVE
Leukocytes, UA: NEGATIVE
Nitrite: NEGATIVE
RBC / HPF: NONE SEEN (ref 0–?)
Specific Gravity, Urine: 1.01 (ref 1.000–1.030)
TOTAL PROTEIN, URINE-UPE24: NEGATIVE
UROBILINOGEN UA: 0.2 (ref 0.0–1.0)
Urine Glucose: NEGATIVE
WBC, UA: NONE SEEN (ref 0–?)
pH: 7 (ref 5.0–8.0)

## 2014-11-17 LAB — PSA: PSA: 0.41 ng/mL (ref 0.10–4.00)

## 2014-11-17 LAB — BASIC METABOLIC PANEL
BUN: 15 mg/dL (ref 6–23)
CALCIUM: 8.7 mg/dL (ref 8.4–10.5)
CO2: 31 meq/L (ref 19–32)
Chloride: 102 mEq/L (ref 96–112)
Creatinine, Ser: 0.74 mg/dL (ref 0.40–1.50)
GFR: 110.52 mL/min (ref >60.00)
GLUCOSE: 85 mg/dL (ref 70–99)
Potassium: 4.6 mEq/L (ref 3.5–5.1)
SODIUM: 140 meq/L (ref 135–145)

## 2014-11-17 LAB — TSH: TSH: 1 u[IU]/mL (ref 0.35–4.50)

## 2014-11-20 ENCOUNTER — Encounter: Payer: Self-pay | Admitting: Internal Medicine

## 2014-11-20 ENCOUNTER — Ambulatory Visit (INDEPENDENT_AMBULATORY_CARE_PROVIDER_SITE_OTHER): Payer: BLUE CROSS/BLUE SHIELD | Admitting: Internal Medicine

## 2014-11-20 VITALS — BP 132/74 | HR 71 | Temp 98.8°F | Ht 72.0 in | Wt 183.4 lb

## 2014-11-20 DIAGNOSIS — Z0000 Encounter for general adult medical examination without abnormal findings: Secondary | ICD-10-CM | POA: Diagnosis not present

## 2014-11-20 DIAGNOSIS — Z23 Encounter for immunization: Secondary | ICD-10-CM

## 2014-11-20 NOTE — Patient Instructions (Addendum)
You had the flu shot today  Please return for Nurse Visit in 2 weeks for the Prevnar 13 (pneumonia shot)  Please continue all other medications as before, and refills have been done if requested.  Please have the pharmacy call with any other refills you may need.  Please continue your efforts at being more active, low cholesterol diet, and weight control.  You are otherwise up to date with prevention measures today.  Please keep your appointments with your specialists as you may have planned  You will be contacted regarding the referral for: colonoscopy  Please return in 1 year for your yearly visit, or sooner if needed, with Lab testing done 3-5 days before

## 2014-11-20 NOTE — Progress Notes (Signed)
Subjective:    Patient ID: Shane Fischer, male    DOB: [birth year], 74 y.o.   MRN: [id number]  HPI  Here for wellness and f/u;  Overall doing ok;  Pt denies Chest pain, worsening SOB, DOE, wheezing, orthopnea, PND, worsening LE edema, palpitations, dizziness or syncope.  Pt denies neurological change such as new headache, facial or extremity weakness.  Pt denies polydipsia, polyuria, or low sugar symptoms. Pt states overall good compliance with treatment and medications, good tolerability, and has been trying to follow appropriate diet.  Pt denies worsening depressive symptoms, suicidal ideation or panic. No fever, night sweats, wt loss, loss of appetite, or other constitutional symptoms.  Pt states good ability with ADL's, has low fall risk, home safety reviewed and adequate, no other significant changes in hearing or vision, and active with exercise.  No current compliants  Has seen ortho for left leg and knee pain, better with recumbant dike exercise. Past Medical History  Diagnosis Date  . HYPERLIPIDEMIA 06/07/2007  . OTITIS MEDIA, ACUTE, RIGHT 02/04/2009  . SINUSITIS- ACUTE-NOS 06/07/2007  . ALLERGIC RHINITIS 06/07/2007  . BENIGN PROSTATIC HYPERTROPHY 06/07/2007  . CELLULITIS/ABSCESS, TRUNK 07/27/2009  . ELEVATED BLOOD PRESSURE WITHOUT DIAGNOSIS OF HYPERTENSION 02/04/2009  . Chronic sinusitis 06/23/2010  . Erectile dysfunction 06/23/2010   Past Surgical History  Procedure Laterality Date  . Inguinal herniorrhapy      right and left  . S/p left shoulder lipoma    . Melanoma excision  2005    right arm    reports that he has quit smoking. He has never used smokeless tobacco. He reports that he drinks about 3.0 oz of alcohol per week. He reports that he does not use illicit drugs. family history includes Cancer in his mother; Colon cancer (age of onset: 53) in his mother; Heart attack in his other. There is no history of Stomach cancer. Allergies  Allergen Reactions  . Amoxicillin-Pot Clavulanate      REACTION: rash   No current outpatient prescriptions on file prior to visit.   No current facility-administered medications on file prior to visit.   Review of Systems Constitutional: Negative for increased diaphoresis, other activity, appetite or siginficant weight change other than noted HENT: Negative for worsening hearing loss, ear pain, facial swelling, mouth sores and neck stiffness.   Eyes: Negative for other worsening pain, redness or visual disturbance.  Respiratory: Negative for shortness of breath and wheezing  Cardiovascular: Negative for chest pain and palpitations.  Gastrointestinal: Negative for diarrhea, blood in stool, abdominal distention or other pain Genitourinary: Negative for hematuria, flank pain or change in urine volume.  Musculoskeletal: Negative for myalgias or other joint complaints.  Skin: Negative for color change and wound or drainage.  Neurological: Negative for syncope and numbness. other than noted Hematological: Negative for adenopathy. or other swelling Psychiatric/Behavioral: Negative for hallucinations, SI, self-injury, decreased concentration or other worsening agitation.      Objective:   Physical Exam BP 132/74 mmHg  Pulse 71  Temp(Src) 98.8 F (37.1 C) (Oral)  Ht 6' (1.829 m)  Wt 183 lb 6 oz (83.178 kg)  BMI 24.86 kg/m2  SpO2 95% VS noted,  Constitutional: Pt is oriented to person, place, and time. Appears well-developed and well-nourished, in no significant distress Head: Normocephalic and atraumatic.  Right Ear: External ear normal.  Left Ear: External ear normal.  Nose: Nose normal.  Mouth/Throat: Oropharynx is clear and moist.  Eyes: Conjunctivae and EOM are normal. Pupils are equal, round, and  reactive to light.  Neck: Normal range of motion. Neck supple. No JVD present. No tracheal deviation present or significant neck LA or mass Cardiovascular: Normal rate, regular rhythm, normal heart sounds and intact distal pulses.    Pulmonary/Chest: Effort normal and breath sounds without rales or wheezing  Abdominal: Soft. Bowel sounds are normal. NT. No HSM  Musculoskeletal: Normal range of motion. Exhibits no edema.  Lymphadenopathy:  Has no cervical adenopathy.  Neurological: Pt is alert and oriented to person, place, and time. Pt has normal reflexes. No cranial nerve deficit. Motor grossly intact Skin: Skin is warm and dry. No rash noted.  Psychiatric:  Has normal mood and affect. Behavior is normal.     Assessment & Plan:

## 2014-11-20 NOTE — Assessment & Plan Note (Signed)
Overall doing well, age appropriate education and counseling updated, referrals for preventative services and immunizations addressed, dietary and smoking counseling addressed, most recent labs reviewed.  I have personally reviewed and have noted:  1) the patient's medical and social history 2) The pt's use of alcohol, tobacco, and illicit drugs 3) The patient's current medications and supplements 4) Functional ability including ADL's, fall risk, home safety risk, hearing and visual impairment 5) Diet and physical activities 6) Evidence for depression or mood disorder 7) The patient's height, weight, and BMI have been recorded in the chart  I have made referrals, and provided counseling and education based on review of the above For flu shot, labs reviewed Lab Results  Component Value Date   WBC 5.2 11/17/2014   HGB 15.4 11/17/2014   HCT 45.5 11/17/2014   PLT 288.0 11/17/2014   GLUCOSE 85 11/17/2014   CHOL 175 11/17/2014   TRIG 42.0 11/17/2014   HDL 58.70 11/17/2014   LDLDIRECT 132.6 09/12/2011   LDLCALC 108* 11/17/2014   ALT 17 11/17/2014   AST 18 11/17/2014   NA 140 11/17/2014   K 4.6 11/17/2014   CL 102 11/17/2014   CREATININE 0.74 11/17/2014   BUN 15 11/17/2014   CO2 31 11/17/2014   TSH 1.00 11/17/2014   PSA 0.41 11/17/2014

## 2014-11-20 NOTE — Progress Notes (Signed)
Pre visit review using our clinic review tool, if applicable. No additional management support is needed unless otherwise documented below in the visit note. 

## 2014-11-20 NOTE — Addendum Note (Signed)
Addended by: Lowella Dandy on: 11/20/2014 03:36 PM   Modules accepted: Orders

## 2014-11-23 ENCOUNTER — Encounter: Payer: Self-pay | Admitting: Gastroenterology

## 2014-12-08 ENCOUNTER — Ambulatory Visit: Payer: BLUE CROSS/BLUE SHIELD

## 2014-12-10 ENCOUNTER — Ambulatory Visit: Payer: BLUE CROSS/BLUE SHIELD

## 2014-12-15 ENCOUNTER — Ambulatory Visit: Payer: BLUE CROSS/BLUE SHIELD

## 2014-12-17 ENCOUNTER — Ambulatory Visit: Payer: BLUE CROSS/BLUE SHIELD

## 2014-12-22 ENCOUNTER — Ambulatory Visit: Payer: BLUE CROSS/BLUE SHIELD

## 2014-12-25 ENCOUNTER — Ambulatory Visit (INDEPENDENT_AMBULATORY_CARE_PROVIDER_SITE_OTHER): Payer: BLUE CROSS/BLUE SHIELD

## 2014-12-25 DIAGNOSIS — Z23 Encounter for immunization: Secondary | ICD-10-CM | POA: Diagnosis not present

## 2015-01-25 ENCOUNTER — Encounter: Payer: BLUE CROSS/BLUE SHIELD | Admitting: Gastroenterology

## 2015-02-09 ENCOUNTER — Ambulatory Visit (AMBULATORY_SURGERY_CENTER): Payer: Self-pay

## 2015-02-09 VITALS — Ht 72.5 in | Wt 187.0 lb

## 2015-02-09 DIAGNOSIS — Z860100 Personal history of colon polyps, unspecified: Secondary | ICD-10-CM

## 2015-02-09 MED ORDER — SUPREP BOWEL PREP KIT 17.5-3.13-1.6 GM/177ML PO SOLN: 1.0000 | Freq: Once | ORAL | Status: DC

## 2015-02-09 NOTE — Progress Notes (Signed)
No home oxygen No diet/weight loss meds No past problems with anesthesia No allergies to eggs or soy  Has email and internet; registered with emmi

## 2015-02-11 ENCOUNTER — Encounter: Payer: Self-pay | Admitting: Gastroenterology

## 2015-02-23 ENCOUNTER — Encounter: Payer: Self-pay | Admitting: Gastroenterology

## 2015-02-23 ENCOUNTER — Ambulatory Visit (AMBULATORY_SURGERY_CENTER): Payer: BLUE CROSS/BLUE SHIELD | Admitting: Gastroenterology

## 2015-02-23 VITALS — BP 153/76 | HR 58 | Temp 98.0°F | Resp 20 | Ht 72.5 in | Wt 187.0 lb

## 2015-02-23 DIAGNOSIS — D124 Benign neoplasm of descending colon: Secondary | ICD-10-CM

## 2015-02-23 DIAGNOSIS — D123 Benign neoplasm of transverse colon: Secondary | ICD-10-CM | POA: Diagnosis not present

## 2015-02-23 DIAGNOSIS — Z800 Family history of malignant neoplasm of digestive organs: Secondary | ICD-10-CM | POA: Diagnosis not present

## 2015-02-23 DIAGNOSIS — Z86010 Personal history of colonic polyps: Secondary | ICD-10-CM | POA: Diagnosis present

## 2015-02-23 DIAGNOSIS — D120 Benign neoplasm of cecum: Secondary | ICD-10-CM

## 2015-02-23 MED ORDER — SODIUM CHLORIDE 0.9 % IV SOLN: 500.0000 mL | INTRAVENOUS | Status: DC

## 2015-02-23 NOTE — Progress Notes (Signed)
Called to room to assist during endoscopic procedure.  Patient ID and intended procedure confirmed with present staff. Received instructions for my participation in the procedure from the performing physician.  

## 2015-02-23 NOTE — Patient Instructions (Signed)
YOU HAD AN ENDOSCOPIC PROCEDURE TODAY AT THE [facility]:   Refer to the procedure report that was given to you for any specific questions about what was found during the examination.  If the procedure report does not answer your questions, please call your gastroenterologist to clarify.  If you requested that your care partner not be given the details of your procedure findings, then the procedure report has been included in a sealed envelope for you to review at your convenience later.  YOU SHOULD EXPECT: Some feelings of bloating in the abdomen. Passage of more gas than usual.  Walking can help get rid of the air that was put into your GI tract during the procedure and reduce the bloating. If you had a lower endoscopy (such as a colonoscopy or flexible sigmoidoscopy) you may notice spotting of blood in your stool or on the toilet paper. If you underwent a bowel prep for your procedure, you may not have a normal bowel movement for a few days.  Please Note:  You might notice some irritation and congestion in your nose or some drainage.  This is from the oxygen used during your procedure.  There is no need for concern and it should clear up in a day or so.  SYMPTOMS TO REPORT IMMEDIATELY:   Following lower endoscopy (colonoscopy or flexible sigmoidoscopy):  Excessive amounts of blood in the stool  Significant tenderness or worsening of abdominal pains  Swelling of the abdomen that is new, acute  Fever of 100F or higher   For urgent or emergent issues, a gastroenterologist can be reached at any hour by calling [phone].   DIET: Your first meal following the procedure should be a small meal and then it is ok to progress to your normal diet. Heavy or fried foods are harder to digest and may make you feel nauseous or bloated.  Likewise, meals heavy in dairy and vegetables can increase bloating.  Drink plenty of fluids but you should avoid alcoholic beverages for 24  hours.  ACTIVITY:  You should plan to take it easy for the rest of today and you should NOT DRIVE or use heavy machinery until tomorrow (because of the sedation medicines used during the test).    FOLLOW UP: Our staff will call the number listed on your records the next business day following your procedure to check on you and address any questions or concerns that you may have regarding the information given to you following your procedure. If we do not reach you, we will leave a message.  However, if you are feeling well and you are not experiencing any problems, there is no need to return our call.  We will assume that you have returned to your regular daily activities without incident.  If any biopsies were taken you will be contacted by phone or by letter within the next 1-3 weeks.  Please call us at [phone] if you have not heard about the biopsies in 3 weeks.    SIGNATURES/CONFIDENTIALITY: You and/or your care partner have signed paperwork which will be entered into your electronic medical record.  These signatures attest to the fact that that the information above on your After Visit Summary has been reviewed and is understood.  Full responsibility of the confidentiality of this discharge information lies with you and/or your care-partner.    Resume medications. Information given on polyps. 

## 2015-02-23 NOTE — Progress Notes (Signed)
Report to PACU, RN, vss, BBS= Clear.  

## 2015-02-23 NOTE — Op Note (Signed)
[place]  [address]. [place], [address]   COLONOSCOPY PROCEDURE REPORT  PATIENT: Shane Fischer, Shane Fischer  MR#: [id number] BIRTHDATE: 1940/11/05 , 74  yrs. old GENDER: male ENDOSCOPIST: Milus Banister, MD PROCEDURE DATE:  02/23/2015 PROCEDURE:   Colonoscopy, surveillance and Colonoscopy with snare polypectomy First Screening Colonoscopy - Avg.  risk and is 50 yrs.  old or older - No.  Prior Negative Screening - Now for repeat screening. N/A  History of Adenoma - Now for follow-up colonoscopy & has been > or = to 3 yrs.  Yes hx of adenoma.  Has been 3 or more years since last colonoscopy.  Polyps removed today? Yes ASA CLASS:   Class II INDICATIONS:Surveillance due to prior colonic neoplasia, FH Colon or Rectal Adenocarcinoma, and several polyps 2013 Dr.  Sharlett Iles colonoscopy, mother had colon cancer in her 34s. MEDICATIONS: Monitored anesthesia care and Propofol 200 mg IV  DESCRIPTION OF PROCEDURE:   After the risks benefits and alternatives of the procedure were thoroughly explained, informed consent was obtained.  The digital rectal exam revealed no abnormalities of the rectum.   The LB [id number]  endoscope was introduced through the anus and advanced to the cecum, which was identified by both the appendix and ileocecal valve. No adverse events experienced.   The quality of the prep was excellent.  The instrument was then slowly withdrawn as the colon was fully examined. Estimated blood loss is zero unless otherwise noted in this procedure report.   COLON FINDINGS: Four sessile polyps ranging between 3-66mm in size were found in the descending colon, transverse colon, and at the cecum.  Polypectomies were performed with a cold snare.  The resection was complete, the polyp tissue was completely retrieved and sent to histology.   The examination was otherwise normal. Retroflexed views revealed no abnormalities. The time to cecum = 3.2 Withdrawal time =  9.7   The scope was withdrawn and the procedure completed. COMPLICATIONS: There were no immediate complications.  ENDOSCOPIC IMPRESSION: 1.  Four sessile polyps ranging between 3-71mm in size were found in the descending colon, transverse colon, and at the cecum; polypectomies were performed with a cold snare 2.   The examination was otherwise normal  RECOMMENDATIONS: If the polyp(s) removed today are proven to be adenomatous (pre-cancerous) polyps, you will need a colonoscopy in 3 years. You will receive a letter within 1-2 weeks with the results of your biopsy as well as final recommendations.  Please call my office if you have not received a letter after 3 weeks.  eSigned:  Milus Banister, MD 02/23/2015 8:35 AM   cc:  Cathlean Cower, MD

## 2015-02-24 ENCOUNTER — Telehealth: Payer: Self-pay | Admitting: *Deleted

## 2015-02-24 NOTE — Telephone Encounter (Signed)
  Follow up Call-  Call back number 02/23/2015  Post procedure Call Back phone  # [phone]  Permission to leave phone message Yes     Patient questions:  Do you have a fever, pain , or abdominal swelling? No. Pain Score  0 *  Have you tolerated food without any problems? Yes.    Have you been able to return to your normal activities? Yes.    Do you have any questions about your discharge instructions: Diet   No. Medications  No. Follow up visit  No.  Do you have questions or concerns about your Care? No.  Actions: * If pain score is 4 or above: No action needed, pain <4.  Pt denies pain at this time- he does state that he felt like he had more gas to pass this time. Able to pass eventually and eat without problems

## 2015-03-05 ENCOUNTER — Encounter: Payer: Self-pay | Admitting: Gastroenterology

## 2015-05-05 ENCOUNTER — Ambulatory Visit (INDEPENDENT_AMBULATORY_CARE_PROVIDER_SITE_OTHER): Payer: BLUE CROSS/BLUE SHIELD | Admitting: Internal Medicine

## 2015-05-05 ENCOUNTER — Encounter: Payer: Self-pay | Admitting: Internal Medicine

## 2015-05-05 VITALS — BP 128/70 | HR 76 | Temp 98.0°F | Resp 16 | Wt 186.0 lb

## 2015-05-05 DIAGNOSIS — R42 Dizziness and giddiness: Secondary | ICD-10-CM | POA: Insufficient documentation

## 2015-05-05 MED ORDER — MECLIZINE HCL 25 MG PO TABS: 12.5000 mg | ORAL_TABLET | Freq: Three times a day (TID) | ORAL | Status: DC | PRN

## 2015-05-05 NOTE — Assessment & Plan Note (Signed)
Two previous episodes, last one 2010 Associated with URI symptoms, no concerning symptoms or stroke or heart disease and no vertigo today Exam normal Cold symptoms improving Wants updated rx for meclizine in case he needs it - sent to pharmacy.  Reminded him it may cause drowsiness Call if questions or concerns

## 2015-05-05 NOTE — Progress Notes (Signed)
Subjective:    Patient ID: Shane Fischer, male    DOB: [birth year], 75 y.o.   MRN: [id number]  HPI He is here for an acute visit for vertigo.    Vertigo:  Yesterday he got out of bed and had an episode of vertigo.  It lasted most of the day and was intermittent.  He has not had any vertigo today.  His granddaughter turned one recently and has had frequent colds.  He gets all her colds.  He has had cold symptoms for about two weeks that are improving.  His cold symptoms are better.  He denies ear pain.    He has had a couple of episodes of vertigo in the past.  He has taken meclizine in the past and it helped.  In the past the vertigo was associated with a cold/ ear infection.  His last episode was in 2010.  He had a severe ear infection at that time.    He denies numbness/tingling and weakness in his arms or legs. He denies chest pain shortness of breath and palpitations.   Medications and allergies reviewed with patient and updated if appropriate.  Patient Active Problem List   Diagnosis Date Noted  . Hematuria 11/05/2012  . Melanoma ([place]) 09/12/2011  . Chronic sinusitis 06/23/2010  . Erectile dysfunction 06/23/2010  . Preventative health care 06/18/2010  . HYPERLIPIDEMIA 06/07/2007  . ALLERGIC RHINITIS 06/07/2007  . BENIGN PROSTATIC HYPERTROPHY 06/07/2007    No current outpatient prescriptions on file prior to visit.   No current facility-administered medications on file prior to visit.    Past Medical History  Diagnosis Date  . HYPERLIPIDEMIA 06/07/2007  . OTITIS MEDIA, ACUTE, RIGHT 02/04/2009  . SINUSITIS- ACUTE-NOS 06/07/2007  . ALLERGIC RHINITIS 06/07/2007  . BENIGN PROSTATIC HYPERTROPHY 06/07/2007  . CELLULITIS/ABSCESS, TRUNK 07/27/2009  . ELEVATED BLOOD PRESSURE WITHOUT DIAGNOSIS OF HYPERTENSION 02/04/2009  . Chronic sinusitis 06/23/2010  . Erectile dysfunction 06/23/2010  . Melanoma ([place]) 09/12/2011    Right arm, 2006 - Dr Kelle Darting -    . Hypertension     Patient denies      Past Surgical History  Procedure Laterality Date  . Inguinal herniorrhapy      right and left  . S/p left shoulder lipoma    . Melanoma excision  2005    right arm    Social History   Social History  . Marital Status: Married    Spouse Name: N/A  . Number of Children: 3  . Years of Education: N/A   Occupational History  . semi-retired now pastor    Social History Main Topics  . Smoking status: Former Smoker    Types: Cigars, Pipe  . Smokeless tobacco: Never Used  . Alcohol Use: 3.0 oz/week    5 Glasses of wine per week  . Drug Use: No  . Sexual Activity: Not Asked   Other Topics Concern  . None   Social History Narrative    Family History  Problem Relation Age of Onset  . Cancer Mother     colon  . Colon cancer Mother 66  . Heart attack Other   . Stomach cancer Neg Hx     Review of Systems  Constitutional: Negative for fever and chills.  HENT: Positive for congestion and sinus pressure (improved). Negative for ear pain and sore throat.   Respiratory: Positive for cough (productive). Negative for shortness of breath and wheezing.   Cardiovascular: Negative for chest pain and palpitations.  Gastrointestinal: Negative for nausea and abdominal pain.  Neurological: Positive for dizziness. Negative for weakness, light-headedness, numbness and headaches.       Objective:   Filed Vitals:   05/05/15 1351  BP: 128/70  Pulse: 76  Temp: 98 F (36.7 C)  Resp: 16   Filed Weights   05/05/15 1351  Weight: 186 lb (84.369 kg)   Body mass index is 24.87 kg/(m^2).   Physical Exam Constitutional: Appears well-developed and well-nourished. No distress.  Neck: Neck supple. No tracheal deviation present. No thyromegaly present.  No carotid bruit. No cervical adenopathy.   Cardiovascular: Normal rate, regular rhythm and normal heart sounds.   No murmur heard.  No edema Pulmonary/Chest: Effort normal and breath sounds normal. No respiratory distress. No  wheezes.  Neuro: normal sensation and strength in all extremities, normal gait      Assessment & Plan:   See Problem List for Assessment and Plan of chronic medical problems.   Follow up as needed

## 2015-05-05 NOTE — Patient Instructions (Signed)
Your vertigo is likely related to your cold symptoms. Meclizine was sent to your pharmacy.  Take as directed if needed.   Call with any concerns.   Vertigo Vertigo means you feel like you or your surroundings are moving when they are not. Vertigo can be dangerous if it occurs when you are at work, driving, or performing difficult activities.  CAUSES  Vertigo occurs when there is a conflict of signals sent to your brain from the visual and sensory systems in your body. There are many different causes of vertigo, including:  Infections, especially in the inner ear.  A bad reaction to a drug or misuse of alcohol and medicines.  Withdrawal from drugs or alcohol.  Rapidly changing positions, such as lying down or rolling over in bed.  A migraine headache.  Decreased blood flow to the brain.  Increased pressure in the brain from a head injury, infection, tumor, or bleeding. SYMPTOMS  You may feel as though the world is spinning around or you are falling to the ground. Because your balance is upset, vertigo can cause nausea and vomiting. You may have involuntary eye movements (nystagmus). DIAGNOSIS  Vertigo is usually diagnosed by physical exam. If the cause of your vertigo is unknown, your caregiver may perform imaging tests, such as an MRI scan (magnetic resonance imaging). TREATMENT  Most cases of vertigo resolve on their own, without treatment. Depending on the cause, your caregiver may prescribe certain medicines. If your vertigo is related to body position issues, your caregiver may recommend movements or procedures to correct the problem. In rare cases, if your vertigo is caused by certain inner ear problems, you may need surgery. HOME CARE INSTRUCTIONS   Follow your caregiver's instructions.  Avoid driving.  Avoid operating heavy machinery.  Avoid performing any tasks that would be dangerous to you or others during a vertigo episode.  Tell your caregiver if you notice that  certain medicines seem to be causing your vertigo. Some of the medicines used to treat vertigo episodes can actually make them worse in some people. SEEK IMMEDIATE MEDICAL CARE IF:   Your medicines do not relieve your vertigo or are making it worse.  You develop problems with talking, walking, weakness, or using your arms, hands, or legs.  You develop severe headaches.  Your nausea or vomiting continues or gets worse.  You develop visual changes.  A family member notices behavioral changes.  Your condition gets worse. MAKE SURE YOU:  Understand these instructions.  Will watch your condition.  Will get help right away if you are not doing well or get worse.   This information is not intended to replace advice given to you by your health care provider. Make sure you discuss any questions you have with your health care provider.   Document Released: 11/30/2004 Document Revised: 05/15/2011 Document Reviewed: 06/15/2014 Elsevier Interactive Patient Education [employer].

## 2015-05-05 NOTE — Progress Notes (Signed)
Pre visit review using our clinic review tool, if applicable. No additional management support is needed unless otherwise documented below in the visit note. 

## 2015-07-10 ENCOUNTER — Other Ambulatory Visit (HOSPITAL_COMMUNITY)
Admission: RE | Admit: 2015-07-10 | Discharge: 2015-07-10 | Disposition: A | Discharge status: Home / Self Care | Payer: BLUE CROSS/BLUE SHIELD | Source: Other Acute Inpatient Hospital | Attending: Family Medicine | Admitting: Family Medicine

## 2015-07-10 ENCOUNTER — Ambulatory Visit (INDEPENDENT_AMBULATORY_CARE_PROVIDER_SITE_OTHER): Payer: BLUE CROSS/BLUE SHIELD | Admitting: Family Medicine

## 2015-07-10 ENCOUNTER — Encounter: Payer: Self-pay | Admitting: Family Medicine

## 2015-07-10 VITALS — BP 110/76 | Temp 98.3°F | Ht 72.5 in | Wt 184.0 lb

## 2015-07-10 DIAGNOSIS — R300 Dysuria: Secondary | ICD-10-CM | POA: Diagnosis not present

## 2015-07-10 DIAGNOSIS — N410 Acute prostatitis: Secondary | ICD-10-CM | POA: Insufficient documentation

## 2015-07-10 DIAGNOSIS — N419 Inflammatory disease of prostate, unspecified: Secondary | ICD-10-CM | POA: Insufficient documentation

## 2015-07-10 LAB — POC URINALSYSI DIPSTICK (AUTOMATED)
Bilirubin, UA: NEGATIVE
GLUCOSE UA: NEGATIVE
Ketones, UA: NEGATIVE
Leukocytes, UA: NEGATIVE
NITRITE UA: NEGATIVE
PH UA: 6
PROTEIN UA: NEGATIVE
SPEC GRAV UA: 1.025
UROBILINOGEN UA: 0.2

## 2015-07-10 MED ORDER — CIPROFLOXACIN HCL 500 MG PO TABS: 500.0000 mg | ORAL_TABLET | Freq: Two times a day (BID) | ORAL | Status: DC

## 2015-07-10 NOTE — Progress Notes (Signed)
Pre visit review using our clinic review tool, if applicable. No additional management support is needed unless otherwise documented below in the visit note. 

## 2015-07-10 NOTE — Progress Notes (Signed)
Subjective:    Patient ID: Shane Fischer, male    DOB: 1940-06-11, 75 y.o.   MRN: [id number]  HPI Here with urinary symptoms   Started within the lat 2 weeks Tried cranberry juice and fluids  Burning sensation comes and goes Off and on bit of nausea  Suspects low grade temp last night   No bladder pain  No rectal pain  No exposure to STDs suspected   Does not get bladder infections easily-has had one in the past   Has had prostatitis at least at least once Hx of enlarged prostate  Saw urologist years ago  Lab Results  Component Value Date   PSA 0.41 11/17/2014   PSA 0.45 11/14/2013   PSA 0.42 06/25/2012    No meds for BPH  Not a lot of voiding symptoms with that   Patient Active Problem List   Diagnosis Date Noted  . Prostatitis 07/10/2015  . Vertigo 05/05/2015  . Hematuria 11/05/2012  . Melanoma ([place]) 09/12/2011  . Chronic sinusitis 06/23/2010  . Erectile dysfunction 06/23/2010  . Preventative health care 06/18/2010  . HYPERLIPIDEMIA 06/07/2007  . ALLERGIC RHINITIS 06/07/2007  . BENIGN PROSTATIC HYPERTROPHY 06/07/2007   Past Medical History  Diagnosis Date  . HYPERLIPIDEMIA 06/07/2007  . OTITIS MEDIA, ACUTE, RIGHT 02/04/2009  . SINUSITIS- ACUTE-NOS 06/07/2007  . ALLERGIC RHINITIS 06/07/2007  . BENIGN PROSTATIC HYPERTROPHY 06/07/2007  . CELLULITIS/ABSCESS, TRUNK 07/27/2009  . ELEVATED BLOOD PRESSURE WITHOUT DIAGNOSIS OF HYPERTENSION 02/04/2009  . Chronic sinusitis 06/23/2010  . Erectile dysfunction 06/23/2010  . Melanoma ([employer]) 09/12/2011    Right arm, 2006 - Dr Kelle Darting -    . Hypertension     Patient denies   Past Surgical History  Procedure Laterality Date  . Inguinal herniorrhapy      right and left  . S/p left shoulder lipoma    . Melanoma excision  2005    right arm   Social History  Substance Use Topics  . Smoking status: Former Smoker    Types: Cigars, Pipe  . Smokeless tobacco: Never Used  . Alcohol Use: 3.0 oz/week    5 Glasses of wine per  week   Family History  Problem Relation Age of Onset  . Cancer Mother     colon  . Colon cancer Mother 51  . Heart attack Other   . Stomach cancer Neg Hx    Allergies  Allergen Reactions  . Amoxicillin-Pot Clavulanate     REACTION: rash   Current Outpatient Prescriptions on File Prior to Visit  Medication Sig Dispense Refill  . meclizine (ANTIVERT) 25 MG tablet Take 0.5-1 tablets (12.5-25 mg total) by mouth 3 (three) times daily as needed for dizziness. (Patient not taking: Reported on 07/10/2015) 30 tablet 0   No current facility-administered medications on file prior to visit.    Review of Systems Review of Systems  Constitutional: Negative for fever, appetite change, fatigue and unexpected weight change.  Eyes: Negative for pain and visual disturbance.  Respiratory: Negative for cough and shortness of breath.   Cardiovascular: Negative for cp or palpitations    Gastrointestinal: Negative for  diarrhea and constipation. neg for rectal pain  Genitourinary: pos for urgency and frequency. neg for blood in urine or flank pain  Skin: Negative for pallor or rash   Neurological: Negative for weakness, light-headedness, numbness and headaches.  Hematological: Negative for adenopathy. Does not bruise/bleed easily.  Psychiatric/Behavioral: Negative for dysphoric mood. The patient is not nervous/anxious.  Objective:   Physical Exam  Constitutional: He appears well-developed and well-nourished. No distress.  HENT:  Head: Normocephalic and atraumatic.  Eyes: Conjunctivae and EOM are normal. Pupils are equal, round, and reactive to light. No scleral icterus.  Neck: Normal range of motion. Neck supple.  Cardiovascular: Normal rate, regular rhythm and normal heart sounds.   Pulmonary/Chest: Effort normal and breath sounds normal.  Abdominal: Soft. Bowel sounds are normal. He exhibits no distension. There is tenderness. There is no rebound.  No cva tenderness  Mild suprapubic  tenderness  Musculoskeletal: He exhibits no edema.  Lymphadenopathy:    He has no cervical adenopathy.  Neurological: He is alert.  Skin: No rash noted.  Psychiatric: He has a normal mood and affect.          Assessment & Plan:   Problem List Items Addressed This Visit      Genitourinary   Prostatitis - Primary    Recurrent in pt with BPH and voiding symptoms  Kathrynn Ducking sent for cx  Cover with cipro 500 bid for 10 d - then update pcp Pt has urologist if needed Update if not starting to improve in a week or if worsening  -esp worsened nausea or any fever       Relevant Orders   Urine culture    Other Visit Diagnoses    Dysuria        Relevant Orders    POCT Urinalysis Dipstick (Automated) (Completed)

## 2015-07-10 NOTE — Patient Instructions (Signed)
Drink lots of water  Take the cipro as directed I suspect this is a prostate infection  We will also send urine for a culture and update you when it returns  Update if not starting to improve in a week or if worsening

## 2015-07-10 NOTE — Assessment & Plan Note (Signed)
Recurrent in pt with BPH and voiding symptoms  Shane Fischer sent for cx  Cover with cipro 500 bid for 10 d - then update pcp Pt has urologist if needed Update if not starting to improve in a week or if worsening  -esp worsened nausea or any fever

## 2015-07-11 LAB — URINE CULTURE: CULTURE: NO GROWTH

## 2015-07-20 ENCOUNTER — Telehealth: Payer: Self-pay | Admitting: Internal Medicine

## 2015-07-20 MED ORDER — CIPROFLOXACIN HCL 500 MG PO TABS: 500.0000 mg | ORAL_TABLET | Freq: Two times a day (BID) | ORAL | Status: DC

## 2015-07-20 NOTE — Telephone Encounter (Signed)
[place] for cipro refill , as it often takes up to 3 wks to improve acute prostatitis, thanks  Corinne to inform pt, I will do rx

## 2015-07-20 NOTE — Telephone Encounter (Signed)
Patient states he only has one cipro pill left for tonight.

## 2015-07-20 NOTE — Telephone Encounter (Signed)
Patient came in on Saturday clinic on 5/6 and was prescribed cipro.  He states most symptoms are about gone but has some burning still during urination.  Please follow up with patient in regards.  Patient uses the [facility] at [place].

## 2015-07-20 NOTE — Telephone Encounter (Signed)
Please advise 

## 2015-07-21 NOTE — Telephone Encounter (Signed)
Patient aware.

## 2015-09-18 ENCOUNTER — Ambulatory Visit (INDEPENDENT_AMBULATORY_CARE_PROVIDER_SITE_OTHER): Payer: BLUE CROSS/BLUE SHIELD | Admitting: Family Medicine

## 2015-09-18 ENCOUNTER — Encounter: Payer: Self-pay | Admitting: Family Medicine

## 2015-09-18 VITALS — BP 134/82 | HR 63 | Temp 98.0°F | Resp 13 | Wt 182.0 lb

## 2015-09-18 DIAGNOSIS — W57XXXA Bitten or stung by nonvenomous insect and other nonvenomous arthropods, initial encounter: Secondary | ICD-10-CM | POA: Diagnosis not present

## 2015-09-18 DIAGNOSIS — T148 Other injury of unspecified body region: Secondary | ICD-10-CM | POA: Diagnosis not present

## 2015-09-18 MED ORDER — DOXYCYCLINE HYCLATE 100 MG PO TABS: 100.0000 mg | ORAL_TABLET | Freq: Two times a day (BID) | ORAL | Status: DC

## 2015-09-18 NOTE — Assessment & Plan Note (Signed)
Has had numerous tick bites now has a fluctuant area behind right knee that is worsening and he believes this tick was present for more that 3 days. He also notes a transient fever and headache earlier in week and now some increased fatigue and weakness. Will treat with Doxycycline 100 mg po bid and encouraged probiotics and to cleanse area with [place] several times a day

## 2015-09-18 NOTE — Progress Notes (Signed)
Pre visit review using our clinic review tool, if applicable. No additional management support is needed unless otherwise documented below in the visit note. 

## 2015-09-18 NOTE — Progress Notes (Signed)
Patient ID: Shane Fischer, male   DOB: May 20, 1940, 75 y.o.   MRN: [id number]   Subjective:    Patient ID: Shane Fischer, male    DOB: [birth year], 75 y.o.   MRN: [id number]  Chief Complaint  Patient presents with  . Tick Bite  . Fatigue    also feels Weak    HPI Patient is in today for evaluation of a tick bite. He has suffered many bites this year but has been able to remove them quickly but this past week he found one behind his right knee that he thinks was present for several days. He removed it but he is now noting, itching, swelling, redness and pain at site. Also endorses. Intermittent HA and fevers as well as weakness this week. Denies CP/palp/SOB/HA/congestion/GI or GU c/o. Taking meds as prescribed  Past Medical History  Diagnosis Date  . HYPERLIPIDEMIA 06/07/2007  . OTITIS MEDIA, ACUTE, RIGHT 02/04/2009  . SINUSITIS- ACUTE-NOS 06/07/2007  . ALLERGIC RHINITIS 06/07/2007  . BENIGN PROSTATIC HYPERTROPHY 06/07/2007  . CELLULITIS/ABSCESS, TRUNK 07/27/2009  . ELEVATED BLOOD PRESSURE WITHOUT DIAGNOSIS OF HYPERTENSION 02/04/2009  . Chronic sinusitis 06/23/2010  . Erectile dysfunction 06/23/2010  . Melanoma ([place]) 09/12/2011    Right arm, 2006 - Dr Kelle Darting -    . Hypertension     Patient denies  . Tick bite 09/18/2015    Past Surgical History  Procedure Laterality Date  . Inguinal herniorrhapy      right and left  . S/p left shoulder lipoma    . Melanoma excision  2005    right arm    Family History  Problem Relation Age of Onset  . Cancer Mother     colon  . Colon cancer Mother 4  . Heart attack Other   . Stomach cancer Neg Hx     Social History   Social History  . Marital Status: Married    Spouse Name: N/A  . Number of Children: 3  . Years of Education: N/A   Occupational History  . semi-retired now pastor    Social History Main Topics  . Smoking status: Former Smoker    Types: Cigars, Pipe  . Smokeless tobacco: Never Used  . Alcohol Use: 3.0 oz/week    5  Glasses of wine per week     Comment: occ  . Drug Use: No  . Sexual Activity: Not on file   Other Topics Concern  . Not on file   Social History Narrative    Outpatient Prescriptions Prior to Visit  Medication Sig Dispense Refill  . meclizine (ANTIVERT) 25 MG tablet Take 0.5-1 tablets (12.5-25 mg total) by mouth 3 (three) times daily as needed for dizziness. (Patient not taking: Reported on 07/10/2015) 30 tablet 0  . ciprofloxacin (CIPRO) 500 MG tablet Take 1 tablet (500 mg total) by mouth 2 (two) times daily. 20 tablet 0   No facility-administered medications prior to visit.    Allergies  Allergen Reactions  . Amoxicillin-Pot Clavulanate     REACTION: rash    Review of Systems  Constitutional: Positive for fever and malaise/fatigue.  HENT: Negative for congestion.   Eyes: Negative for blurred vision.  Respiratory: Negative for shortness of breath.   Cardiovascular: Negative for chest pain, palpitations and leg swelling.  Gastrointestinal: Negative for nausea, abdominal pain and blood in stool.  Genitourinary: Negative for dysuria and frequency.  Musculoskeletal: Negative for falls.  Skin: Positive for itching and rash.  Neurological: Positive for weakness. Negative for  dizziness, loss of consciousness and headaches.  Endo/Heme/Allergies: Negative for environmental allergies.  Psychiatric/Behavioral: Negative for depression. The patient is not nervous/anxious.        Objective:    Physical Exam  Constitutional: He is oriented to person, place, and time. He appears well-developed and well-nourished. No distress.  HENT:  Head: Normocephalic and atraumatic.  Nose: Nose normal.  Eyes: Right eye exhibits no discharge. Left eye exhibits no discharge.  Neck: Normal range of motion. Neck supple.  Cardiovascular: Normal rate and regular rhythm.   No murmur heard. Pulmonary/Chest: Effort normal and breath sounds normal.  Abdominal: Soft. Bowel sounds are normal. There is no  tenderness.  Musculoskeletal: He exhibits no edema.  Neurological: He is alert and oriented to person, place, and time.  Skin: Skin is warm and dry. Rash noted. There is erythema.  1 x 3 cm raised fluctuant lesion on back of right knee with scab in center.   Psychiatric: He has a normal mood and affect.  Nursing note and vitals reviewed.   BP 134/82 mmHg  Pulse 63  Temp(Src) 98 F (36.7 C) (Oral)  Resp 13  Wt 182 lb (82.555 kg)  SpO2 98% Wt Readings from Last 3 Encounters:  09/18/15 182 lb (82.555 kg)  07/10/15 184 lb (83.462 kg)  05/05/15 186 lb (84.369 kg)     Lab Results  Component Value Date   WBC 5.2 11/17/2014   HGB 15.4 11/17/2014   HCT 45.5 11/17/2014   PLT 288.0 11/17/2014   GLUCOSE 85 11/17/2014   CHOL 175 11/17/2014   TRIG 42.0 11/17/2014   HDL 58.70 11/17/2014   LDLDIRECT 132.6 09/12/2011   LDLCALC 108* 11/17/2014   ALT 17 11/17/2014   AST 18 11/17/2014   NA 140 11/17/2014   K 4.6 11/17/2014   CL 102 11/17/2014   CREATININE 0.74 11/17/2014   BUN 15 11/17/2014   CO2 31 11/17/2014   TSH 1.00 11/17/2014   PSA 0.41 11/17/2014    Lab Results  Component Value Date   TSH 1.00 11/17/2014   Lab Results  Component Value Date   WBC 5.2 11/17/2014   HGB 15.4 11/17/2014   HCT 45.5 11/17/2014   MCV 95.0 11/17/2014   PLT 288.0 11/17/2014   Lab Results  Component Value Date   NA 140 11/17/2014   K 4.6 11/17/2014   CO2 31 11/17/2014   GLUCOSE 85 11/17/2014   BUN 15 11/17/2014   CREATININE 0.74 11/17/2014   BILITOT 1.0 11/17/2014   ALKPHOS 40 11/17/2014   AST 18 11/17/2014   ALT 17 11/17/2014   PROT 6.3 11/17/2014   ALBUMIN 3.9 11/17/2014   CALCIUM 8.7 11/17/2014   GFR 110.52 11/17/2014   Lab Results  Component Value Date   CHOL 175 11/17/2014   Lab Results  Component Value Date   HDL 58.70 11/17/2014   Lab Results  Component Value Date   LDLCALC 108* 11/17/2014   Lab Results  Component Value Date   TRIG 42.0 11/17/2014   Lab  Results  Component Value Date   CHOLHDL 3 11/17/2014   No results found for: HGBA1C     Assessment & Plan:   Problem List Items Addressed This Visit    Tick bite - Primary    Has had numerous tick bites now has a fluctuant area behind right knee that is worsening and he believes this tick was present for more that 3 days. He also notes a transient fever and headache earlier in week  and now some increased fatigue and weakness. Will treat with Doxycycline 100 mg po bid and encouraged probiotics and to cleanse area with [place] several times a day         I have discontinued Mr. Gillim ciprofloxacin. I am also having him start on doxycycline. Additionally, I am having him maintain his meclizine.  Meds ordered this encounter  Medications  . doxycycline (VIBRA-TABS) 100 MG tablet    Sig: Take 1 tablet (100 mg total) by mouth 2 (two) times daily.    Dispense:  20 tablet    Refill:  0     Penni Homans, MD

## 2015-09-18 NOTE — Patient Instructions (Addendum)
[email or web address]  NOW company 1 Cap of 10 strain probiotic daily Probiotic at all pharmacies is [employer] or [employer]  Tick Bite Information Ticks are insects that attach themselves to the skin and draw blood for food. There are various types of ticks. Common types include wood ticks and deer ticks. Most ticks live in shrubs and grassy areas. Ticks can climb onto your body when you make contact with leaves or grass where the tick is waiting. The most common places on the body for ticks to attach themselves are the scalp, neck, armpits, waist, and groin. Most tick bites are harmless, but sometimes ticks carry germs that cause diseases. These germs can be spread to a person during the tick's feeding process. The chance of a disease spreading through a tick bite depends on:   The type of tick.  Time of year.   How long the tick is attached.   Geographic location.  HOW CAN YOU PREVENT TICK BITES? Take these steps to help prevent tick bites when you are outdoors:  Wear protective clothing. Long sleeves and long pants are best.   Wear white clothes so you can see ticks more easily.  Tuck your pant legs into your socks.   If walking on a trail, stay in the middle of the trail to avoid brushing against bushes.  Avoid walking through areas with long grass.  Put insect repellent on all exposed skin and along boot tops, pant legs, and sleeve cuffs.   Check clothing, hair, and skin repeatedly and before going inside.   Brush off any ticks that are not attached.  Take a shower or bath as soon as possible after being outdoors.  WHAT IS THE PROPER WAY TO REMOVE A TICK? Ticks should be removed as soon as possible to help prevent diseases caused by tick bites. 1. If latex gloves are available, put them on before trying to remove a tick.  2. Using fine-point tweezers, grasp the tick as close to the skin as  possible. You may also use curved forceps or a tick removal tool. Grasp the tick as close to its head as possible. Avoid grasping the tick on its body. 3. Pull gently with steady upward pressure until the tick lets go. Do not twist the tick or jerk it suddenly. This may break off the tick's head or mouth parts. 4. Do not squeeze or crush the tick's body. This could force disease-carrying fluids from the tick into your body.  5. After the tick is removed, wash the bite area and your hands with soap and water or other disinfectant such as alcohol. 6. Apply a small amount of antiseptic cream or ointment to the bite site.  7. Wash and disinfect any instruments that were used.  Do not try to remove a tick by applying a hot match, petroleum jelly, or fingernail polish to the tick. These methods do not work and may increase the chances of disease being spread from the tick bite.  WHEN SHOULD YOU SEEK MEDICAL CARE? Contact your health care provider if you are unable to remove a tick from your skin or if a part of the tick breaks off and is stuck in the skin.  After a tick bite, you need to be aware of signs and symptoms that could be related to diseases spread by ticks. Contact your health care provider if you develop any of the following in the days or weeks after the tick  bite:  Unexplained fever.  Rash. A circular rash that appears days or weeks after the tick bite may indicate the possibility of Lyme disease. The rash may resemble a target with a bull's-eye and may occur at a different part of your body than the tick bite.  Redness and swelling in the area of the tick bite.   Tender, swollen lymph glands.   Diarrhea.   Weight loss.   Cough.   Fatigue.   Muscle, joint, or bone pain.   Abdominal pain.   Headache.   Lethargy or a change in your level of consciousness.  Difficulty walking or moving your legs.   Numbness in the legs.   Paralysis.  Shortness of breath.    Confusion.   Repeated vomiting.    This information is not intended to replace advice given to you by your health care provider. Make sure you discuss any questions you have with your health care provider.   Document Released: 02/18/2000 Document Revised: 03/13/2014 Document Reviewed: 07/31/2012 Elsevier Interactive Patient Education 2016 [email or web address]

## 2016-03-28 ENCOUNTER — Ambulatory Visit (INDEPENDENT_AMBULATORY_CARE_PROVIDER_SITE_OTHER): Payer: Medicare Other

## 2016-03-28 DIAGNOSIS — Z23 Encounter for immunization: Secondary | ICD-10-CM

## 2016-06-15 ENCOUNTER — Ambulatory Visit (INDEPENDENT_AMBULATORY_CARE_PROVIDER_SITE_OTHER): Payer: Medicare Other | Admitting: Internal Medicine

## 2016-06-15 ENCOUNTER — Encounter: Payer: Self-pay | Admitting: Internal Medicine

## 2016-06-15 VITALS — BP 117/72 | HR 67 | Temp 98.6°F | Ht 72.0 in | Wt 183.0 lb

## 2016-06-15 DIAGNOSIS — Z0000 Encounter for general adult medical examination without abnormal findings: Secondary | ICD-10-CM

## 2016-06-15 DIAGNOSIS — Z0001 Encounter for general adult medical examination with abnormal findings: Secondary | ICD-10-CM

## 2016-06-15 DIAGNOSIS — K219 Gastro-esophageal reflux disease without esophagitis: Secondary | ICD-10-CM

## 2016-06-15 DIAGNOSIS — K5900 Constipation, unspecified: Secondary | ICD-10-CM

## 2016-06-15 MED ORDER — ASPIRIN EC 81 MG PO TBEC: 81.0000 mg | DELAYED_RELEASE_TABLET | Freq: Every day | ORAL | 11 refills | Status: DC

## 2016-06-15 MED ORDER — PANTOPRAZOLE SODIUM 40 MG PO TBEC: 40.0000 mg | DELAYED_RELEASE_TABLET | Freq: Every day | ORAL | 3 refills | Status: DC

## 2016-06-15 NOTE — Progress Notes (Signed)
Subjective:    Patient ID: Shane Fischer, male    DOB: August 05, 1940, 76 y.o.   MRN: [id number]  HPI  Here for wellness and f/u;  Overall doing ok;  Pt denies Chest pain, worsening SOB, DOE, wheezing, orthopnea, PND, worsening LE edema, palpitations, dizziness or syncope.  Pt denies neurological change such as new headache, facial or extremity weakness.  Pt denies polydipsia, polyuria, or low sugar symptoms. Pt states overall good compliance with treatment and medications, good tolerability, and has been trying to follow appropriate diet.  Pt denies worsening depressive symptoms, suicidal ideation or panic. No fever, night sweats, wt loss, loss of appetite, or other constitutional symptoms.  Pt states good ability with ADL's, has low fall risk, home safety reviewed and adequate, no other significant changes in hearing or vision, and only occasionally active with exercise.  No other changes to history except:  Unfortunately since feb 2018 with indigestion, bloating, hard stools/constipation, and persisted despite trying to change diet with anit-reflux precautions, Has had mild worsening reflux, but no other abd pain except occasional crampy pain,,and no dysphagia, n/v, fever or blood.  Has had hx of intermitent constipation for many yrs, seemed  Better in recent years when he stopped mild for bfast.  Has gone back to cereal since he found lactaid to take with it. No ice cream, and only occas cheese. Last colonoscopy 2016 with f/u planned for 2019, no malignancy.  No other activity change except maybe more active in retirement with yardwork (has 5 acres).   Past Medical History:  Diagnosis Date  . ALLERGIC RHINITIS 06/07/2007  . BENIGN PROSTATIC HYPERTROPHY 06/07/2007  . CELLULITIS/ABSCESS, TRUNK 07/27/2009  . Chronic sinusitis 06/23/2010  . ELEVATED BLOOD PRESSURE WITHOUT DIAGNOSIS OF HYPERTENSION 02/04/2009  . Erectile dysfunction 06/23/2010  . HYPERLIPIDEMIA 06/07/2007  . Hypertension    Patient denies  .  Melanoma ([place]) 09/12/2011   Right arm, 2006 - Dr Kelle Darting -    . OTITIS MEDIA, ACUTE, RIGHT 02/04/2009  . SINUSITIS- ACUTE-NOS 06/07/2007  . Tick bite 09/18/2015   Past Surgical History:  Procedure Laterality Date  . inguinal herniorrhapy     right and left  . MELANOMA EXCISION  2005   right arm  . s/p left shoulder lipoma      reports that he has quit smoking. His smoking use included Cigars and Pipe. He has never used smokeless tobacco. He reports that he drinks about 3.0 oz of alcohol per week . He reports that he does not use drugs. family history includes Cancer in his mother; Colon cancer (age of onset: 101) in his mother; Heart attack in his other. Allergies  Allergen Reactions  . Amoxicillin-Pot Clavulanate     REACTION: rash   Current Outpatient Prescriptions on File Prior to Visit  Medication Sig Dispense Refill  . meclizine (ANTIVERT) 25 MG tablet Take 0.5-1 tablets (12.5-25 mg total) by mouth 3 (three) times daily as needed for dizziness. (Patient not taking: Reported on 07/10/2015) 30 tablet 0   No current facility-administered medications on file prior to visit.    Review of Systems Constitutional: Negative for other unusual diaphoresis, sweats, appetite or weight changes HENT: Negative for other worsening hearing loss, ear pain, facial swelling, mouth sores or neck stiffness.   Eyes: Negative for other worsening pain, redness or other visual disturbance.  Respiratory: Negative for other stridor or swelling Cardiovascular: Negative for other palpitations or other chest pain  Gastrointestinal: Negative for worsening diarrhea or loose stools, blood in  stool, distention or other pain Genitourinary: Negative for hematuria, flank pain or other change in urine volume.  Musculoskeletal: Negative for myalgias or other joint swelling.  Skin: Negative for other color change, or other wound or worsening drainage.  Neurological: Negative for other syncope or numbness. Hematological:  Negative for other adenopathy or swelling Psychiatric/Behavioral: Negative for hallucinations, other worsening agitation, SI, self-injury, or new decreased concentration All other system neg per pt    Objective:   Physical Exam BP 117/72   Pulse 67   Temp 98.6 F (37 C) (Oral)   Ht 6' (1.829 m)   Wt 183 lb (83 kg)   SpO2 99%   BMI 24.82 kg/m  VS noted,  Constitutional: Pt appears in NAD HENT: Head: NCAT.  Right Ear: External ear normal.  Left Ear: External ear normal.  Eyes: . Pupils are equal, round, and reactive to light. Conjunctivae and EOM are normal Nose: without d/c or deformity Neck: Neck supple. Gross normal ROM Cardiovascular: Normal rate and regular rhythm.   Pulmonary/Chest: Effort normal and breath sounds without rales or wheezing.  Abd:  Soft, NT, ND, + BS, no organomegaly Neurological: Pt is alert. At baseline orientation, motor grossly intact Skin: Skin is warm. No rashes, other new lesions, no LE edema Psychiatric: Pt behavior is normal without agitation  No other exam findings    Assessment & Plan:

## 2016-06-15 NOTE — Patient Instructions (Addendum)
Please start the Aspirin 81 mg - 1 per day, to prevent stroke and heart disease  Please take all new medication as prescribed - the protonix 40 mg per day  OK to take Miralax OTC even daily if needed  Please continue all other medications as before, and refills have been done if requested.  Please have the pharmacy call with any other refills you may need.  Please continue your efforts at being more active, low cholesterol diet, and weight control.  You are otherwise up to date with prevention measures today.  Please keep your appointments with your specialists as you may have planned  Please go to the LAB in the Basement (turn left off the elevator) for the tests to be done at your convenience  You will be contacted by phone if any changes need to be made immediately.  Otherwise, you will receive a letter about your results with an explanation, but please check with MyChart first.  Please remember to sign up for MyChart if you have not done so, as this will be important to you in the future with finding out test results, communicating by private email, and scheduling acute appointments online when needed.  Please return in 1 year for your yearly visit, or sooner if needed, with Lab testing done 3-5 days before

## 2016-06-15 NOTE — Progress Notes (Signed)
Pre visit review using our clinic review tool, if applicable. No additional management support is needed unless otherwise documented below in the visit note. 

## 2016-06-15 NOTE — Assessment & Plan Note (Signed)

## 2016-06-17 ENCOUNTER — Encounter: Payer: Self-pay | Admitting: Internal Medicine

## 2016-06-17 DIAGNOSIS — K219 Gastro-esophageal reflux disease without esophagitis: Secondary | ICD-10-CM

## 2016-06-17 DIAGNOSIS — K5900 Constipation, unspecified: Secondary | ICD-10-CM | POA: Insufficient documentation

## 2016-06-17 HISTORY — DX: Gastro-esophageal reflux disease without esophagitis: K21.9

## 2016-06-17 NOTE — Assessment & Plan Note (Signed)
Mild to mod, for protonix 40 qd,  to f/u any worsening symptoms or concerns 

## 2016-06-17 NOTE — Assessment & Plan Note (Signed)
For miralax qd prn

## 2016-06-19 ENCOUNTER — Other Ambulatory Visit (INDEPENDENT_AMBULATORY_CARE_PROVIDER_SITE_OTHER): Payer: Medicare Other

## 2016-06-19 DIAGNOSIS — Z0000 Encounter for general adult medical examination without abnormal findings: Secondary | ICD-10-CM | POA: Diagnosis not present

## 2016-06-19 LAB — URINALYSIS, ROUTINE W REFLEX MICROSCOPIC
Bilirubin Urine: NEGATIVE
Ketones, ur: NEGATIVE
Leukocytes, UA: NEGATIVE
NITRITE: NEGATIVE
Specific Gravity, Urine: 1.02 (ref 1.000–1.030)
TOTAL PROTEIN, URINE-UPE24: NEGATIVE
Urine Glucose: NEGATIVE
Urobilinogen, UA: 0.2 (ref 0.0–1.0)
WBC, UA: NONE SEEN (ref 0–?)
pH: 6 (ref 5.0–8.0)

## 2016-06-19 LAB — BASIC METABOLIC PANEL
BUN: 18 mg/dL (ref 6–23)
CALCIUM: 9.2 mg/dL (ref 8.4–10.5)
CO2: 30 mEq/L (ref 19–32)
Chloride: 104 mEq/L (ref 96–112)
Creatinine, Ser: 0.8 mg/dL (ref 0.40–1.50)
GFR: 100.56 mL/min (ref >60.00)
GLUCOSE: 107 mg/dL — AB (ref 70–99)
Potassium: 5 mEq/L (ref 3.5–5.1)
Sodium: 140 mEq/L (ref 135–145)

## 2016-06-19 LAB — CBC WITH DIFFERENTIAL/PLATELET
BASOS PCT: 1 % (ref 0.0–3.0)
Basophils Absolute: 0 10*3/uL (ref 0.0–0.1)
EOS ABS: 0 10*3/uL (ref 0.0–0.7)
EOS PCT: 1.2 % (ref 0.0–5.0)
HCT: 46.9 % (ref 39.0–52.0)
HEMOGLOBIN: 15.7 g/dL (ref 13.0–17.0)
LYMPHS ABS: 1.2 10*3/uL (ref 0.7–4.0)
Lymphocytes Relative: 27.2 % (ref 12.0–46.0)
MCHC: 33.3 g/dL (ref 30.0–36.0)
MCV: 97.2 fl (ref 78.0–100.0)
MONO ABS: 0.5 10*3/uL (ref 0.1–1.0)
Monocytes Relative: 11.5 % (ref 3.0–12.0)
NEUTROS ABS: 2.5 10*3/uL (ref 1.4–7.7)
NEUTROS PCT: 59.1 % (ref 43.0–77.0)
PLATELETS: 271 10*3/uL (ref 150.0–400.0)
RBC: 4.83 Mil/uL (ref 4.22–5.81)
RDW: 14.5 % (ref 11.5–15.5)
WBC: 4.3 10*3/uL (ref 4.0–10.5)

## 2016-06-19 LAB — TSH: TSH: 0.92 u[IU]/mL (ref 0.35–4.50)

## 2016-06-19 LAB — HEPATIC FUNCTION PANEL
ALK PHOS: 37 U/L — AB (ref 39–117)
ALT: 14 U/L (ref 0–53)
AST: 17 U/L (ref 0–37)
Albumin: 4.2 g/dL (ref 3.5–5.2)
BILIRUBIN DIRECT: 0.2 mg/dL (ref 0.0–0.3)
Total Bilirubin: 1 mg/dL (ref 0.2–1.2)
Total Protein: 6.6 g/dL (ref 6.0–8.3)

## 2016-06-19 LAB — LIPID PANEL
CHOLESTEROL: 196 mg/dL (ref 0–200)
HDL: 70.4 mg/dL (ref >39.00)
LDL CALC: 118 mg/dL — AB (ref 0–99)
NonHDL: 125.93
TRIGLYCERIDES: 38 mg/dL (ref 0.0–149.0)
Total CHOL/HDL Ratio: 3
VLDL: 7.6 mg/dL (ref 0.0–40.0)

## 2016-06-19 LAB — PSA: PSA: 0.67 ng/mL (ref 0.10–4.00)

## 2016-08-10 ENCOUNTER — Ambulatory Visit (INDEPENDENT_AMBULATORY_CARE_PROVIDER_SITE_OTHER): Payer: Medicare Other | Admitting: Internal Medicine

## 2016-08-10 ENCOUNTER — Encounter: Payer: Self-pay | Admitting: Internal Medicine

## 2016-08-10 VITALS — BP 136/86 | HR 82 | Ht 72.0 in | Wt 182.0 lb

## 2016-08-10 DIAGNOSIS — R21 Rash and other nonspecific skin eruption: Secondary | ICD-10-CM | POA: Diagnosis not present

## 2016-08-10 DIAGNOSIS — W57XXXA Bitten or stung by nonvenomous insect and other nonvenomous arthropods, initial encounter: Secondary | ICD-10-CM | POA: Diagnosis not present

## 2016-08-10 MED ORDER — DOXYCYCLINE HYCLATE 100 MG PO TABS: 100.0000 mg | ORAL_TABLET | Freq: Two times a day (BID) | ORAL | 0 refills | Status: DC

## 2016-08-10 MED ORDER — TRIAMCINOLONE ACETONIDE 0.1 % EX CREA: 1.0000 "application " | TOPICAL_CREAM | Freq: Two times a day (BID) | CUTANEOUS | 0 refills | Status: DC

## 2016-08-10 NOTE — Assessment & Plan Note (Signed)
Multiple, for empiric doxy course,  to f/u any worsening symptoms or concerns

## 2016-08-10 NOTE — Progress Notes (Signed)
Subjective:    Patient ID: Shane Fischer, male    DOB: 1940-12-11, 76 y.o.   MRN: [id number]  HPI  Here to f/u with c/o multiple tick bites in the past 2 wks, one each to right thigh, left ant thigh, right post leg just prox to the knee, and also something else such as a different insect sting or bite to the right calf just below the knee posteriorly. No fever, rash or other joint or CNS symptoms.  Pt denies chest pain, increased sob or doe, wheezing, orthopnea, PND, increased LE swelling, palpitations, dizziness or syncope. Past Medical History:  Diagnosis Date  . ALLERGIC RHINITIS 06/07/2007  . BENIGN PROSTATIC HYPERTROPHY 06/07/2007  . CELLULITIS/ABSCESS, TRUNK 07/27/2009  . Chronic sinusitis 06/23/2010  . ELEVATED BLOOD PRESSURE WITHOUT DIAGNOSIS OF HYPERTENSION 02/04/2009  . Erectile dysfunction 06/23/2010  . GERD (gastroesophageal reflux disease) 06/17/2016  . HYPERLIPIDEMIA 06/07/2007  . Hypertension    Patient denies  . Melanoma ([place]) 09/12/2011   Right arm, 2006 - Dr Kelle Darting -    . OTITIS MEDIA, ACUTE, RIGHT 02/04/2009  . SINUSITIS- ACUTE-NOS 06/07/2007  . Tick bite 09/18/2015   Past Surgical History:  Procedure Laterality Date  . inguinal herniorrhapy     right and left  . MELANOMA EXCISION  2005   right arm  . s/p left shoulder lipoma      reports that he has quit smoking. His smoking use included Cigars and Pipe. He has never used smokeless tobacco. He reports that he drinks about 3.0 oz of alcohol per week . He reports that he does not use drugs. family history includes Cancer in his mother; Colon cancer (age of onset: 52) in his mother; Heart attack in his other. Allergies  Allergen Reactions  . Amoxicillin-Pot Clavulanate     REACTION: rash   Current Outpatient Prescriptions on File Prior to Visit  Medication Sig Dispense Refill  . aspirin EC 81 MG tablet Take 1 tablet (81 mg total) by mouth daily. 90 tablet 11  . meclizine (ANTIVERT) 25 MG tablet Take 0.5-1 tablets  (12.5-25 mg total) by mouth 3 (three) times daily as needed for dizziness. 30 tablet 0  . pantoprazole (PROTONIX) 40 MG tablet Take 1 tablet (40 mg total) by mouth daily. 90 tablet 3   No current facility-administered medications on file prior to visit.    Review of Systems All other system neg per pt    Objective:   Physical Exam BP 136/86   Pulse 82   Ht 6' (1.829 m)   Wt 182 lb (82.6 kg)   SpO2 99%   BMI 24.68 kg/m  VS noted,  Constitutional: Pt appears in NAD HENT: Head: NCAT.  Right Ear: External ear normal.  Left Ear: External ear normal.  Eyes: . Pupils are equal, round, and reactive to light. Conjunctivae and EOM are normal Nose: without d/c or deformity Neck: Neck supple. Gross normal ROM Cardiovascular: Normal rate and regular rhythm.   Pulmonary/Chest: Effort normal and breath sounds without rales or wheezing.  Neurological: Pt is alert. At baseline orientation, motor grossly intact Skin: Skin is warm. Has mult tick bite sites as above, but No rashes, also has 2 cm area just distal to the right post knee at the calf with itchy raised swollen but nontender, nonfluctuant and no drainage, also no LE edema Psychiatric: Pt behavior is normal without agitation  No other exam findings  Lab Results  Component Value Date   WBC 4.3 06/19/2016  HGB 15.7 06/19/2016   HCT 46.9 06/19/2016   PLT 271.0 06/19/2016   GLUCOSE 107 (H) 06/19/2016   CHOL 196 06/19/2016   TRIG 38.0 06/19/2016   HDL 70.40 06/19/2016   LDLDIRECT 132.6 09/12/2011   LDLCALC 118 (H) 06/19/2016   ALT 14 06/19/2016   AST 17 06/19/2016   NA 140 06/19/2016   K 5.0 06/19/2016   CL 104 06/19/2016   CREATININE 0.80 06/19/2016   BUN 18 06/19/2016   CO2 30 06/19/2016   TSH 0.92 06/19/2016   PSA 0.67 06/19/2016       Assessment & Plan:

## 2016-08-10 NOTE — Assessment & Plan Note (Signed)
To post right calf, c/w inflammation/hypersens rxan to insect sting or bite, for triam cr prn

## 2016-08-10 NOTE — Patient Instructions (Addendum)
Please take all new medication as prescribed - the doxycycline, as well as the cream if needed  Please apply DEET/bug repellant prior to working in the yard in future  Please continue all other medications as before, and refills have been done if requested.  Please have the pharmacy call with any other refills you may need.  Please keep your appointments with your specialists as you may have planned

## 2016-09-14 ENCOUNTER — Ambulatory Visit (INDEPENDENT_AMBULATORY_CARE_PROVIDER_SITE_OTHER): Payer: Medicare Other | Admitting: *Deleted

## 2016-09-14 VITALS — BP 124/66 | HR 59 | Resp 20 | Ht 72.0 in | Wt 185.0 lb

## 2016-09-14 DIAGNOSIS — Z0000 Encounter for general adult medical examination without abnormal findings: Secondary | ICD-10-CM | POA: Diagnosis not present

## 2016-09-14 NOTE — Progress Notes (Signed)
Pre visit review using our clinic review tool, if applicable. No additional management support is needed unless otherwise documented below in the visit note. 

## 2016-09-14 NOTE — Progress Notes (Addendum)
Subjective:   Shane Fischer is a 76 y.o. male who presents for an Initial Medicare Annual Wellness Visit.  Review of Systems  *No ROS.  Medicare Wellness Visit. Additional risk factors are reflected in the social history.  Cardiac Risk Factors include: advanced age (>55men, >41 women);male gender;dyslipidemia Sleep patterns: feels rested on waking, gets up 1-2 times nightly to void and sleeps 6-7 hours nightly.   Home Safety/Smoke Alarms: Feels safe in home. Smoke alarms in place.  Living environment; residence and Firearm Safety: 1-story house/ trailer, no firearms. Lives alone, no needs for DME, good support system  Seat Belt Safety/Bike Helmet: Wears seat belt.   Counseling:   Eye Exam- appointment yearly  Dr. [facility]- appointment every 6 months   Male:   [facility]- Last 02/23/15, precancerous polyps, recall 3 years    PSA-  Lab Results  Component Value Date   PSA 0.67 06/19/2016   PSA 0.41 11/17/2014   PSA 0.45 11/14/2013      Objective:    Today's Vitals   09/14/16 1632  BP: 124/66  Pulse: (!) 59  Resp: 20  SpO2: 99%  Weight: 185 lb (83.9 kg)  Height: 6' (1.829 m)   Body mass index is 25.09 kg/m.  Current Medications (verified) Outpatient Encounter Prescriptions as of 09/14/2016  Medication Sig  . aspirin EC 81 MG tablet Take 1 tablet (81 mg total) by mouth daily.  . meclizine (ANTIVERT) 25 MG tablet Take 0.5-1 tablets (12.5-25 mg total) by mouth 3 (three) times daily as needed for dizziness.  . pantoprazole (PROTONIX) 40 MG tablet Take 1 tablet (40 mg total) by mouth daily.  . [DISCONTINUED] doxycycline (VIBRA-TABS) 100 MG tablet Take 1 tablet (100 mg total) by mouth 2 (two) times daily. (Patient not taking: Reported on 09/14/2016)  . [DISCONTINUED] triamcinolone cream (KENALOG) 0.1 % Apply 1 application topically 2 (two) times daily. (Patient not taking: Reported on 09/14/2016)   No facility-administered encounter medications on file as of 09/14/2016.      Allergies (verified) Patient has no active allergies.   History: Past Medical History:  Diagnosis Date  . ALLERGIC RHINITIS 06/07/2007  . BENIGN PROSTATIC HYPERTROPHY 06/07/2007  . CELLULITIS/ABSCESS, TRUNK 07/27/2009  . Chronic sinusitis 06/23/2010  . ELEVATED BLOOD PRESSURE WITHOUT DIAGNOSIS OF HYPERTENSION 02/04/2009  . Erectile dysfunction 06/23/2010  . GERD (gastroesophageal reflux disease) 06/17/2016  . HYPERLIPIDEMIA 06/07/2007  . Hypertension    Patient denies  . Melanoma ([place]) 09/12/2011   Right arm, 2006 - Dr Shane Fischer -    . OTITIS MEDIA, ACUTE, RIGHT 02/04/2009  . SINUSITIS- ACUTE-NOS 06/07/2007  . Tick bite 09/18/2015   Past Surgical History:  Procedure Laterality Date  . inguinal herniorrhapy     right and left  . MELANOMA EXCISION  2005   right arm  . s/p left shoulder lipoma     Family History  Problem Relation Age of Onset  . Cancer Mother        colon  . Colon cancer Mother 25  . Heart attack Other   . Stomach cancer Neg Hx    Social History   Occupational History  . semi-retired now pastor    Social History Main Topics  . Smoking status: Former Smoker    Types: Cigars, Pipe  . Smokeless tobacco: Never Used  . Alcohol use 3.0 oz/week    5 Glasses of wine per week     Comment: occ  . Drug use: No  . Sexual activity: Not on  file   Tobacco Counseling Counseling given: Not Answered   Activities of Daily Living In your present state of health, do you have any difficulty performing the following activities: 09/14/2016  Hearing? N  Vision? N  Difficulty concentrating or making decisions? N  Walking or climbing stairs? N  Dressing or bathing? N  Doing errands, shopping? N  Preparing Food and eating ? N  Using the Toilet? N  In the past six months, have you accidently leaked urine? N  Do you have problems with loss of bowel control? N  Managing your Medications? N  Managing your Finances? N  Housekeeping or managing your Housekeeping? N  Some  recent data might be hidden    Immunizations and Health Maintenance Immunization History  Administered Date(s) Administered  . H1N1 05/11/2008  . Influenza Whole 12/05/2007  . Influenza, High Dose Seasonal PF 03/28/2016  . Influenza, Seasonal, Injecte, Preservative Fre 02/07/2012  . Influenza,inj,Quad PF,36+ Mos 11/05/2012, 11/18/2013, 11/20/2014  . Pneumococcal Conjugate-13 12/25/2014  . Pneumococcal Polysaccharide-23 05/11/2008  . Td 05/11/2008  . Tdap 11/18/2013  . Zoster 11/18/2013   There are no preventive care reminders to display for this patient.  Patient Care Team: Biagio Borg, MD as PCP - General Bernardo Heater, Ronda Fairly, MD (Urology) Milus Banister, MD as Attending Physician (Gastroenterology)  Indicate any recent Medical Services you may have received from other than [facility] providers in the past year (date may be approximate).    Assessment:   This is a routine wellness examination for Shane Fischer. Physical assessment deferred to PCP.   Hearing/Vision screen Hearing Screening Comments: Able to hear conversational tones w/o difficulty. No issues reported.  Passed whisper test Vision Screening Comments: Wears glasses  Dietary issues and exercise activities discussed: Current Exercise Habits: Home exercise routine, Type of exercise: walking;Other - see comments (maintain 5 acres), Time (Minutes): 55, Frequency (Times/Week): 5, Weekly Exercise (Minutes/Week): 275, Intensity: Mild, Exercise limited by: None identified  Diet (meal preparation, eat out, water intake, caffeinated beverages, dairy products, fruits and vegetables): in general, a "healthy" diet  , well balanced, low fat/ cholesterol, low salt eats a variety of fruits and vegetables daily, limits salt, fat/cholesterol, sugar, caffeine, drinks 1-2 glasses of water daily.  encouraged patient to increase daily water intake.  Goals    . Maintain current health status          Continue to eat healthy and exercise, enjoy  life and family.       Depression Screen PHQ 2/9 Scores 09/14/2016 06/15/2016 11/20/2014 11/18/2013  PHQ - 2 Score 0 0 1 0    Fall Risk Fall Risk  09/14/2016 06/15/2016 11/20/2014 11/18/2013  Falls in the past year? No No No No    Cognitive Function:       Ad8 score reviewed for issues:  Issues making decisions: no  Less interest in hobbies / activities: no  Repeats questions, stories (family complaining): no  Trouble using ordinary gadgets (microwave, computer, phone):no  Forgets the month or year: no  Mismanaging finances: no  Remembering appts: no  Daily problems with thinking and/or memory: no Ad8 score is= 0  Screening Tests Health Maintenance  Topic Date Due  . INFLUENZA VACCINE  10/04/2016  . COLONOSCOPY  02/22/2018  . TETANUS/TDAP  11/19/2023  . PNA vac Low Risk Adult  Completed        Plan:    Continue doing brain stimulating activities (puzzles, reading, adult coloring books, staying active) to keep memory sharp.  Continue to eat heart healthy diet (full of fruits, vegetables, whole grains, lean protein, water--limit salt, fat, and sugar intake) and increase physical activity as tolerated.  I have personally reviewed and noted the following in the patient's chart:   . Medical and social history . Use of alcohol, tobacco or illicit drugs  . Current medications and supplements . Functional ability and status . Nutritional status . Physical activity . Advanced directives . List of other physicians . Vitals . Screenings to include cognitive, depression, and falls . Referrals and appointments  In addition, I have reviewed and discussed with patient certain preventive protocols, quality metrics, and best practice recommendations. A written personalized care plan for preventive services as well as general preventive health recommendations were provided to patient.     Michiel Cowboy, RN   09/14/2016   Medical screening  examination/treatment/procedure(s) were performed by non-physician practitioner and as supervising physician I was immediately available for consultation/collaboration. I agree with above. Cathlean Cower, MD

## 2016-09-14 NOTE — Patient Instructions (Addendum)
Continue doing brain stimulating activities (puzzles, reading, adult coloring books, staying active) to keep memory sharp.   Continue to eat heart healthy diet (full of fruits, vegetables, whole grains, lean protein, water--limit salt, fat, and sugar intake) and increase physical activity as tolerated.   [facility], counselling in [place], [place] Address: [address] [place], [place] [address] Hours: Closes soon: 5PM ? [facility] Phone: [phone]  Esophagitis Esophagitis is inflammation of the esophagus. The esophagus is the tube that carries food and liquids from your mouth to your stomach. Esophagitis can cause soreness or pain in the esophagus. This condition can make it difficult and painful to swallow. What are the causes? Most causes of esophagitis are not serious. Common causes of this condition include:  Gastroesophageal reflux disease (GERD). This is when stomach contents move back up into the esophagus (reflux).  Repeated vomiting.  An allergic-type reaction, especially caused by food allergies (eosinophilic esophagitis).  Injury to the esophagus by swallowing large pills with or without water, or swallowing certain types of medicines.  Swallowing (ingesting) harmful chemicals, such as household cleaning products.  Heavy alcohol use.  An infection of the esophagus.This most often occurs in people who have a weakened immune system.  Radiation or chemotherapy treatment for cancer.  Certain diseases such as sarcoidosis, Crohn disease, and scleroderma.  What are the signs or symptoms? Symptoms of this condition include:  Difficult or painful swallowing.  Pain with swallowing acidic liquids, such as citrus juices.  Pain with burping.  Chest pain.  Difficulty breathing.  Nausea.  Vomiting.  Pain in the abdomen.  Weight loss.  Ulcers in the mouth.  Patches of white material in the mouth  (candidiasis).  Fever.  Coughing up blood or vomiting blood.  Stool that is black, tarry, or bright red.  How is this diagnosed? Your health care provider will take a medical history and perform a physical exam. You may also have other tests, including:  An endoscopy to examine your stomach and esophagus with a small camera.  A test that measures the acidity level in your esophagus.  A test that measures how much pressure is on your esophagus.  A barium swallow or modified barium swallow to show the shape, size, and functioning of your esophagus.  Allergy tests.  How is this treated? Treatment for this condition depends on the cause of your esophagitis. In some cases, steroids or other medicines may be given to help relieve your symptoms or to treat the underlying cause of your condition. You may have to make some lifestyle changes, such as:  Avoiding alcohol.  Quitting smoking.  Changing your diet.  Exercising.  Changing your sleep habits and your sleep environment.  Follow these instructions at home: Take these actions to decrease your discomfort and to help avoid complications. Diet  Follow a diet as recommended by your health care provider. This may involve avoiding foods and drinks such as: ? Coffee and tea (with or without caffeine). ? Drinks that contain alcohol. ? Energy drinks and sports drinks. ? Carbonated drinks or sodas. ? Chocolate and cocoa. ? Peppermint and mint flavorings. ? Garlic and onions. ? Horseradish. ? Spicy and acidic foods, including peppers, chili powder, curry powder, vinegar, hot sauces, and barbecue sauce. ? Citrus fruit juices and citrus fruits, such as oranges, lemons, and limes. ? Tomato-based foods, such as red sauce, chili, salsa, and pizza with red sauce. ? Fried and fatty foods, such as donuts, french  fries, potato chips, and high-fat dressings. ? High-fat meats, such as hot dogs and fatty cuts of red and white meats, such as  rib eye steak, sausage, ham, and bacon. ? High-fat dairy items, such as whole milk, butter, and cream cheese.  Eat small, frequent meals instead of large meals.  Avoid drinking large amounts of liquid with your meals.  Avoid eating meals during the 2-3 hours before bedtime.  Avoid lying down right after you eat.  Do not exercise right after you eat.  Avoid foods and drinks that seem to make your symptoms worse. General instructions  Pay attention to any changes in your symptoms.  Take over-the-counter and prescription medicines only as told by your health care provider. Do not take aspirin, ibuprofen, or other NSAIDs unless your health care provider told you to do so.  If you have trouble taking pills, use a pill splitter to decrease the size of the pill. This will decrease the chance of the pill getting stuck or injuring your esophagus on the way down. Also, drink water after you take a pill.  Do not use any tobacco products, including cigarettes, chewing tobacco, and e-cigarettes. If you need help quitting, ask your health care provider.  Wear loose-fitting clothing. Do not wear anything tight around your waist that causes pressure on your abdomen.  Raise (elevate) the head of your bed about 6 inches (15 cm).  Try to reduce your stress, such as with yoga or meditation. If you need help reducing stress, ask your health care provider.  If you are overweight, reduce your weight to an amount that is healthy for you. Ask your health care provider for guidance about a safe weight loss goal.  Keep all follow-up visits as told by your health care provider. This is important. Contact a health care provider if:  You have new symptoms.  You have unexplained weight loss.  You have difficulty swallowing, or it hurts to swallow.  You have wheezing or a persistent cough.  Your symptoms do not improve with treatment.  You have frequent heartburn for more than two weeks. Get help right  away if:  You have severe pain in your arms, neck, jaw, teeth, or back.  You feel sweaty, dizzy, or light-headed.  You have chest pain or shortness of breath.  You vomit and your vomit looks like blood or coffee grounds.  Your stool is bloody or black.  You have a fever.  You cannot swallow, drink, or eat. This information is not intended to replace advice given to you by your health care provider. Make sure you discuss any questions you have with your health care provider. Document Released: 03/30/2004 Document Revised: 07/29/2015 Document Reviewed: 06/17/2014 Elsevier Interactive Patient Education  2018 [place] for Gastroesophageal Reflux Disease, Adult When you have gastroesophageal reflux disease (GERD), the foods you eat and your eating habits are very important. Choosing the right foods can help ease your discomfort. What guidelines do I need to follow?  Choose fruits, vegetables, whole grains, and low-fat dairy products.  Choose low-fat meat, fish, and poultry.  Limit fats such as oils, salad dressings, butter, nuts, and avocado.  Keep a food diary. This helps you identify foods that cause symptoms.  Avoid foods that cause symptoms. These may be different for everyone.  Eat small meals often instead of 3 large meals a day.  Eat your meals slowly, in a place where you are relaxed.  Limit fried foods.  Cook foods using  methods other than frying.  Avoid drinking alcohol.  Avoid drinking large amounts of liquids with your meals.  Avoid bending over or lying down until 2-3 hours after eating. What foods are not recommended? These are some foods and drinks that may make your symptoms worse: Vegetables Tomatoes. Tomato juice. Tomato and spaghetti sauce. Chili peppers. Onion and garlic. Horseradish. Fruits Oranges, grapefruit, and lemon (fruit and juice). Meats High-fat meats, fish, and poultry. This includes hot dogs, ribs, ham, sausage, salami,  and bacon. Dairy Whole milk and chocolate milk. Sour cream. Cream. Butter. Ice cream. Cream cheese. Drinks Coffee and tea. Bubbly (carbonated) drinks or energy drinks. Condiments Hot sauce. Barbecue sauce. Sweets/Desserts Chocolate and cocoa. Donuts. Peppermint and spearmint. Fats and Oils High-fat foods. This includes [place] fries and potato chips. Other Vinegar. Strong spices. This includes black pepper, white pepper, red pepper, cayenne, curry powder, cloves, ginger, and chili powder. The items listed above may not be a complete list of foods and drinks to avoid. Contact your dietitian for more information. This information is not intended to replace advice given to you by your health care provider. Make sure you discuss any questions you have with your health care provider. Document Released: 08/22/2011 Document Revised: 07/29/2015 Document Reviewed: 12/25/2012 Elsevier Interactive Patient Education  2017 [employer].

## 2016-12-29 ENCOUNTER — Ambulatory Visit (INDEPENDENT_AMBULATORY_CARE_PROVIDER_SITE_OTHER): Payer: Medicare Other | Admitting: *Deleted

## 2016-12-29 DIAGNOSIS — Z23 Encounter for immunization: Secondary | ICD-10-CM | POA: Diagnosis not present

## 2017-01-03 ENCOUNTER — Ambulatory Visit (INDEPENDENT_AMBULATORY_CARE_PROVIDER_SITE_OTHER): Payer: Medicare Other | Admitting: Nurse Practitioner

## 2017-01-03 ENCOUNTER — Encounter: Payer: Self-pay | Admitting: Nurse Practitioner

## 2017-01-03 ENCOUNTER — Other Ambulatory Visit (INDEPENDENT_AMBULATORY_CARE_PROVIDER_SITE_OTHER): Payer: Medicare Other

## 2017-01-03 VITALS — BP 152/80 | HR 68 | Ht 70.5 in | Wt 186.1 lb

## 2017-01-03 DIAGNOSIS — G8929 Other chronic pain: Secondary | ICD-10-CM | POA: Diagnosis not present

## 2017-01-03 DIAGNOSIS — R1013 Epigastric pain: Secondary | ICD-10-CM

## 2017-01-03 DIAGNOSIS — R142 Eructation: Secondary | ICD-10-CM

## 2017-01-03 LAB — HEPATIC FUNCTION PANEL
ALT: 17 U/L (ref 0–53)
AST: 18 U/L (ref 0–37)
Albumin: 4.2 g/dL (ref 3.5–5.2)
Alkaline Phosphatase: 37 U/L — ABNORMAL LOW (ref 39–117)
Bilirubin, Direct: 0.2 mg/dL (ref 0.0–0.3)
Total Bilirubin: 1.1 mg/dL (ref 0.2–1.2)
Total Protein: 7.1 g/dL (ref 6.0–8.3)

## 2017-01-03 LAB — LIPASE: LIPASE: 3 U/L — AB (ref 11.0–59.0)

## 2017-01-03 NOTE — Patient Instructions (Signed)
If you are age 74 or older, your body mass index should be between 23-30. Your Body mass index is 26.33 kg/m. If this is out of the aforementioned range listed, please consider follow up with your Primary Care Provider.  If you are age 58 or younger, your body mass index should be between 19-25. Your Body mass index is 26.33 kg/m. If this is out of the aformentioned range listed, please consider follow up with your Primary Care Provider.   Your physician has requested that you go to the basement for the following lab work before leaving today: Lipase Hepatic Function  You have been scheduled for an abdominal ultrasound at [facility] (1st floor of hospital) on 01/08/17 at 1030 am. Please arrive 15 minutes prior to your appointment for registration. Make certain not to have anything to eat or drink after midnight the night before prior to your appointment. Should you need to reschedule your appointment, please contact radiology at [phone]. This test typically takes about 30 minutes to perform.  Will call you with results.  Thank you for choosing me and [facility].   Tye Savoy, NP

## 2017-01-03 NOTE — Progress Notes (Signed)
     Chief Complaint:  Epigastric pain  HPI: Patient is a 76 year old male known to Dr. Ardis Hughs.  He has history of adenomatous colon polyps and is due for surveillance colonoscopy December 2019.   Patient is here for evaluation of epigastric pain.  In January patient began having what he refers to as indigestion consisting of reflux, upper abdominal discomfort, belching and bloating.  Upper abdominal pain is described as a dull ache, nonradiating.   Symptoms are not related to eating, nor physical activity. Episodes can last for a day or 2.  Symptoms were occurring every day until PCP started PPI in April.   Chemistry profile and CBC were unremarkable.  He was started on daily Prilosec and his symptoms significantly improved.    With the exception of reflux he has recently begun having same sx again. Patient had been taking a baby aspirin up until a couple of weeks ago.  No other NSAIDs.  His weight has been stable, actually up a few pounds since April. No bowel changes.    Past Medical History:  Diagnosis Date  . ALLERGIC RHINITIS 06/07/2007  . BENIGN PROSTATIC HYPERTROPHY 06/07/2007  . CELLULITIS/ABSCESS, TRUNK 07/27/2009  . Chronic sinusitis 06/23/2010  . ELEVATED BLOOD PRESSURE WITHOUT DIAGNOSIS OF HYPERTENSION 02/04/2009  . Erectile dysfunction 06/23/2010  . GERD (gastroesophageal reflux disease) 06/17/2016  . HYPERLIPIDEMIA 06/07/2007  . Hypertension    Patient denies  . Melanoma ([place]) 09/12/2011   Right arm, 2006 - Dr Kelle Darting -    . OTITIS MEDIA, ACUTE, RIGHT 02/04/2009  . SINUSITIS- ACUTE-NOS 06/07/2007  . Tick bite 09/18/2015    Patient's surgical history, family medical history, social history, medications and allergies were all reviewed in Epic    Physical Exam: BP (!) 152/80 (BP Location: Left Arm, Patient Position: Sitting, Cuff Size: Normal)   Pulse 68   Ht 5' 10.5" (1.791 m) Comment: height measured without shoes  Wt 186 lb 2 oz (84.4 kg)   BMI 26.33 kg/m   GENERAL:  pleasant white male in NAD PSYCH: :Pleasant, cooperative, normal affect EENT:  conjunctiva pink, mucous membranes moist, neck supple without masses CARDIAC:  RRR, no murmur heard, no peripheral edema PULM: Normal respiratory effort, lungs CTA bilaterally, no wheezing ABDOMEN:  Nondistended, soft, nontender. No obvious masses, no hepatomegaly,  normal bowel sounds SKIN:  turgor, no lesions seen Musculoskeletal:  Normal muscle tone, normal strength NEURO: Alert and oriented x 3, no focal neurologic deficits   ASSESSMENT and PLAN:  Pleasant 76 yo male with 63-month history of inonradiating, dull epigastric pain associated with bloating and belching. No relationship to meals or activity. Symptoms initially responded well to PPI but now having breakthrough symptoms.  His weight is stable and that is reassuring. He was having reflux as well but that has resolved with the PPI.   -It has been over 6 months since last labs. Obtain LFTs, lipase.  -Obtain abdominal ultrasound -If above studies are unremarkable then consider EGD.  -If EGD is negative then will proceed with a CT scan in very near future  Tye Savoy , NP 01/03/2017, 2:01 PM

## 2017-01-04 NOTE — Progress Notes (Signed)
I agree with the above note, plan 

## 2017-01-08 ENCOUNTER — Ambulatory Visit (HOSPITAL_COMMUNITY)
Admission: RE | Admit: 2017-01-08 | Discharge: 2017-01-08 | Disposition: A | Discharge status: Home / Self Care | Payer: Medicare Other | Source: Ambulatory Visit | Attending: Nurse Practitioner | Admitting: Nurse Practitioner

## 2017-01-08 DIAGNOSIS — G8929 Other chronic pain: Secondary | ICD-10-CM | POA: Insufficient documentation

## 2017-01-08 DIAGNOSIS — N281 Cyst of kidney, acquired: Secondary | ICD-10-CM | POA: Insufficient documentation

## 2017-01-08 DIAGNOSIS — R1013 Epigastric pain: Secondary | ICD-10-CM | POA: Diagnosis not present

## 2017-01-11 ENCOUNTER — Other Ambulatory Visit: Payer: Self-pay

## 2017-01-11 ENCOUNTER — Ambulatory Visit (AMBULATORY_SURGERY_CENTER): Payer: Self-pay

## 2017-01-11 VITALS — Ht 70.0 in | Wt 185.8 lb

## 2017-01-11 DIAGNOSIS — R1013 Epigastric pain: Secondary | ICD-10-CM

## 2017-01-11 NOTE — Progress Notes (Signed)
Denies allergies to eggs or soy products. Denies complication of anesthesia or sedation. Denies use of weight loss medication. Denies use of O2.   Emmi instructions declined.  

## 2017-01-23 DIAGNOSIS — H35373 Puckering of macula, bilateral: Secondary | ICD-10-CM | POA: Diagnosis not present

## 2017-01-23 DIAGNOSIS — H52221 Regular astigmatism, right eye: Secondary | ICD-10-CM | POA: Diagnosis not present

## 2017-01-23 DIAGNOSIS — H43393 Other vitreous opacities, bilateral: Secondary | ICD-10-CM | POA: Diagnosis not present

## 2017-01-23 DIAGNOSIS — H2513 Age-related nuclear cataract, bilateral: Secondary | ICD-10-CM | POA: Diagnosis not present

## 2017-01-24 ENCOUNTER — Encounter: Payer: Self-pay | Admitting: Gastroenterology

## 2017-02-02 ENCOUNTER — Other Ambulatory Visit: Payer: Self-pay

## 2017-02-02 ENCOUNTER — Encounter: Payer: Self-pay | Admitting: Gastroenterology

## 2017-02-02 ENCOUNTER — Ambulatory Visit (AMBULATORY_SURGERY_CENTER): Payer: Medicare Other | Admitting: Gastroenterology

## 2017-02-02 VITALS — BP 97/61 | HR 60 | Temp 97.3°F | Resp 9 | Ht 70.0 in | Wt 185.0 lb

## 2017-02-02 DIAGNOSIS — K2900 Acute gastritis without bleeding: Secondary | ICD-10-CM

## 2017-02-02 DIAGNOSIS — K219 Gastro-esophageal reflux disease without esophagitis: Secondary | ICD-10-CM | POA: Diagnosis not present

## 2017-02-02 DIAGNOSIS — R1013 Epigastric pain: Secondary | ICD-10-CM

## 2017-02-02 DIAGNOSIS — K449 Diaphragmatic hernia without obstruction or gangrene: Secondary | ICD-10-CM

## 2017-02-02 HISTORY — PX: UPPER GASTROINTESTINAL ENDOSCOPY: SHX188

## 2017-02-02 MED ORDER — SODIUM CHLORIDE 0.9 % IV SOLN: 500.0000 mL | INTRAVENOUS | Status: DC

## 2017-02-02 NOTE — Patient Instructions (Signed)
Impression/Recommendations:  Hiatal hernia handout given to patient. Gastritis handout given to patient.  Resume previous diet. Continue present medications.  Await pathology results. If biopsies show H. Pylori you will be treated appropriately with antibiotics.  If not, will consider further testing for your symptoms.  YOU HAD AN ENDOSCOPIC PROCEDURE TODAY AT [facility]:   Refer to the procedure report that was given to you for any specific questions about what was found during the examination.  If the procedure report does not answer your questions, please call your gastroenterologist to clarify.  If you requested that your care partner not be given the details of your procedure findings, then the procedure report has been included in a sealed envelope for you to review at your convenience later.  YOU SHOULD EXPECT: Some feelings of bloating in the abdomen. Passage of more gas than usual.  Walking can help get rid of the air that was put into your GI tract during the procedure and reduce the bloating. If you had a lower endoscopy (such as a colonoscopy or flexible sigmoidoscopy) you may notice spotting of blood in your stool or on the toilet paper. If you underwent a bowel prep for your procedure, you may not have a normal bowel movement for a few days.  Please Note:  You might notice some irritation and congestion in your nose or some drainage.  This is from the oxygen used during your procedure.  There is no need for concern and it should clear up in a day or so.  SYMPTOMS TO REPORT IMMEDIATELY:   Following upper endoscopy (EGD)  Vomiting of blood or coffee ground material  New chest pain or pain under the shoulder blades  Painful or persistently difficult swallowing  New shortness of breath  Fever of 100F or higher  Black, tarry-looking stools  For urgent or emergent issues, a gastroenterologist can be reached at any hour by calling [phone].   DIET:  We  do recommend a small meal at first, but then you may proceed to your regular diet.  Drink plenty of fluids but you should avoid alcoholic beverages for 24 hours.  ACTIVITY:  You should plan to take it easy for the rest of today and you should NOT DRIVE or use heavy machinery until tomorrow (because of the sedation medicines used during the test).    FOLLOW UP: Our staff will call the number listed on your records the next business day following your procedure to check on you and address any questions or concerns that you may have regarding the information given to you following your procedure. If we do not reach you, we will leave a message.  However, if you are feeling well and you are not experiencing any problems, there is no need to return our call.  We will assume that you have returned to your regular daily activities without incident.  If any biopsies were taken you will be contacted by phone or by letter within the next 1-3 weeks.  Please call us at [phone] if you have not heard about the biopsies in 3 weeks.    SIGNATURES/CONFIDENTIALITY: You and/or your care partner have signed paperwork which will be entered into your electronic medical record.  These signatures attest to the fact that that the information above on your After Visit Summary has been reviewed and is understood.  Full responsibility of the confidentiality of this discharge information lies with you and/or your care-partner.

## 2017-02-02 NOTE — Progress Notes (Signed)
Pt's states no medical or surgical changes since previsit or office visit. 

## 2017-02-02 NOTE — Progress Notes (Signed)
Report to PACU, RN, vss, BBS= Clear.  

## 2017-02-02 NOTE — Progress Notes (Signed)
Called to room to assist during endoscopic procedure.  Patient ID and intended procedure confirmed with present staff. Received instructions for my participation in the procedure from the performing physician.  

## 2017-02-02 NOTE — Op Note (Signed)
[facility] Patient Name: Shane Fischer Procedure Date: 02/02/2017 9:19 AM MRN: [id number] Endoscopist: Milus Banister , MD Age: 76 Referring MD:  Date of Birth: [birth year] Gender: Male Account #: [id number] Procedure:                Upper GI endoscopy Indications:              Dyspepsia, Indigestion Medicines:                Monitored Anesthesia Care Procedure:                Pre-Anesthesia Assessment:                           - Prior to the procedure, a History and Physical                            was performed, and patient medications and                            allergies were reviewed. The patient's tolerance of                            previous anesthesia was also reviewed. The risks                            and benefits of the procedure and the sedation                            options and risks were discussed with the patient.                            All questions were answered, and informed consent                            was obtained. Prior Anticoagulants: The patient has                            taken no previous anticoagulant or antiplatelet                            agents. ASA Grade Assessment: II - A patient with                            mild systemic disease. After reviewing the risks                            and benefits, the patient was deemed in                            satisfactory condition to undergo the procedure.                           After obtaining informed consent, the endoscope was  passed under direct vision. Throughout the                            procedure, the patient's blood pressure, pulse, and                            oxygen saturations were monitored continuously. The                            Endoscope was introduced through the mouth, and                            advanced to the second part of duodenum. The upper                            GI endoscopy was accomplished  without difficulty.                            The patient tolerated the procedure well. Scope In: Scope Out: Findings:                 The esophagus was normal.                           A small hiatal hernia was present.                           Mild inflammation characterized by erythema and                            friability was found in the gastric antrum.                            Biopsies were taken with a cold forceps for                            histology.                           The examined duodenum was normal. Complications:            No immediate complications. Estimated blood loss:                            None. Estimated Blood Loss:     Estimated blood loss: none. Impression:               - Normal esophagus.                           - Small hiatal hernia.                           - Mild gastritis. Biopsied.                           - Normal examined duodenum. Recommendation:           -  Patient has a contact number available for                            emergencies. The signs and symptoms of potential                            delayed complications were discussed with the                            patient. Return to normal activities tomorrow.                            Written discharge instructions were provided to the                            patient.                           - Resume previous diet.                           - Continue present medications.                           - Await pathology results. If biopsies show H.                            pylori you will be treated with appropriaae                            antibiotics. If not, will consider further testing                            (CT scan abd/pelvis) for your symptoms. Milus Banister, MD 02/02/2017 9:33:07 AM This report has been signed electronically.

## 2017-02-05 ENCOUNTER — Telehealth: Payer: Self-pay

## 2017-02-05 NOTE — Telephone Encounter (Signed)
  Follow up Call-  Call back number 02/02/2017 02/23/2015  Post procedure Call Back phone  # [phone]  Permission to leave phone message Yes Yes  Some recent data might be hidden     Patient questions:  Do you have a fever, pain , or abdominal swelling? No. Pain Score  0 *  Have you tolerated food without any problems? Yes.    Have you been able to return to your normal activities? Yes.    Do you have any questions about your discharge instructions: Diet   No. Medications  No. Follow up visit  No.  Do you have questions or concerns about your Care? No.  Actions: * If pain score is 4 or above: No action needed, pain <4.

## 2017-03-22 DIAGNOSIS — G43819 Other migraine, intractable, without status migrainosus: Secondary | ICD-10-CM | POA: Diagnosis not present

## 2017-03-22 DIAGNOSIS — H524 Presbyopia: Secondary | ICD-10-CM | POA: Diagnosis not present

## 2017-03-22 DIAGNOSIS — H43393 Other vitreous opacities, bilateral: Secondary | ICD-10-CM | POA: Diagnosis not present

## 2017-03-22 DIAGNOSIS — H2513 Age-related nuclear cataract, bilateral: Secondary | ICD-10-CM | POA: Diagnosis not present

## 2017-03-22 DIAGNOSIS — H35373 Puckering of macula, bilateral: Secondary | ICD-10-CM | POA: Diagnosis not present

## 2017-03-26 ENCOUNTER — Ambulatory Visit: Payer: Self-pay

## 2017-03-26 NOTE — Telephone Encounter (Signed)
Patient called in with c/o "chest tightness." He said " it happened yesterday while in church around 1130 and it was just an uncomfortable feeling that stayed in the center of my chest. It was not pain, but just a tight feeling. I also had it this morning and it lasts 15 minutes. It's not like the feeling I had when it was indigestion, but different." Denies cough, radiation of tightness, SOB. According to protocol, see PCP within 3 days, appointment made for tomorrow, care advice given, patient verbalized understanding.  Reason for Disposition . [1] Intermittent chest pain from "angina" AND [2] NO increase in severity or frequency  Answer Assessment - Initial Assessment Questions 1. LOCATION: "Where does it hurt?"       Center-tightness, no pain 2. RADIATION: "Does the pain go anywhere else?" (e.g., into neck, jaw, arms, back)     Denies 3. ONSET: "When did the chest pain begin?" (Minutes, hours or days)      Yesterday 1130 4. PATTERN "Does the pain come and go, or has it been constant since it started?"  "Does it get worse with exertion?"      Come and go 5. DURATION: "How long does it last" (e.g., seconds, minutes, hours)     15-20 mins 6. SEVERITY: "How bad is the pain?"  (e.g., Scale 1-10; mild, moderate, or severe)    - MILD (1-3): doesn't interfere with normal activities     - MODERATE (4-7): interferes with normal activities or awakens from sleep    - SEVERE (8-10): excruciating pain, unable to do any normal activities       Denies 7. CARDIAC RISK FACTORS: "Do you have any history of heart problems or risk factors for heart disease?" (e.g., prior heart attack, angina; high blood pressure, diabetes, being overweight, high cholesterol, smoking, or strong family history of heart disease)     None for me. Dad died at age 62 with a sudden heart attack 8. PULMONARY RISK FACTORS: "Do you have any history of lung disease?"  (e.g., blood clots in lung, asthma, emphysema, birth control pills)    Denies 9. CAUSE: "What do you think is causing the chest pain?"     Unknown 10. OTHER SYMPTOMS: "Do you have any other symptoms?" (e.g., dizziness, nausea, vomiting, sweating, fever, difficulty breathing, cough)       Denies 11. PREGNANCY: "Is there any chance you are pregnant?" "When was your last menstrual period?"       N/A  Protocols used: CHEST PAIN-A-AH

## 2017-03-27 ENCOUNTER — Ambulatory Visit (INDEPENDENT_AMBULATORY_CARE_PROVIDER_SITE_OTHER): Payer: Medicare Other | Admitting: Internal Medicine

## 2017-03-27 ENCOUNTER — Encounter: Payer: Self-pay | Admitting: Internal Medicine

## 2017-03-27 ENCOUNTER — Other Ambulatory Visit (INDEPENDENT_AMBULATORY_CARE_PROVIDER_SITE_OTHER): Payer: Medicare Other

## 2017-03-27 ENCOUNTER — Ambulatory Visit (INDEPENDENT_AMBULATORY_CARE_PROVIDER_SITE_OTHER)
Admission: RE | Admit: 2017-03-27 | Discharge: 2017-03-27 | Disposition: A | Discharge status: Home / Self Care | Payer: Medicare Other | Source: Ambulatory Visit | Attending: Internal Medicine | Admitting: Internal Medicine

## 2017-03-27 VITALS — BP 144/82 | HR 65 | Temp 97.9°F | Ht 70.0 in | Wt 184.0 lb

## 2017-03-27 DIAGNOSIS — R0789 Other chest pain: Secondary | ICD-10-CM

## 2017-03-27 DIAGNOSIS — H2512 Age-related nuclear cataract, left eye: Secondary | ICD-10-CM | POA: Diagnosis not present

## 2017-03-27 LAB — BASIC METABOLIC PANEL
BUN: 16 mg/dL (ref 6–23)
CALCIUM: 8.9 mg/dL (ref 8.4–10.5)
CO2: 30 meq/L (ref 19–32)
CREATININE: 0.79 mg/dL (ref 0.40–1.50)
Chloride: 102 mEq/L (ref 96–112)
GFR: 101.82 mL/min (ref >60.00)
GLUCOSE: 97 mg/dL (ref 70–99)
Potassium: 3.9 mEq/L (ref 3.5–5.1)
SODIUM: 138 meq/L (ref 135–145)

## 2017-03-27 LAB — CBC WITH DIFFERENTIAL/PLATELET
BASOS ABS: 0 10*3/uL (ref 0.0–0.1)
Basophils Relative: 0.7 % (ref 0.0–3.0)
Eosinophils Absolute: 0.1 10*3/uL (ref 0.0–0.7)
Eosinophils Relative: 0.9 % (ref 0.0–5.0)
HCT: 45 % (ref 39.0–52.0)
HEMOGLOBIN: 15.1 g/dL (ref 13.0–17.0)
Lymphocytes Relative: 26.6 % (ref 12.0–46.0)
Lymphs Abs: 1.5 10*3/uL (ref 0.7–4.0)
MCHC: 33.5 g/dL (ref 30.0–36.0)
MCV: 97.7 fl (ref 78.0–100.0)
MONO ABS: 0.6 10*3/uL (ref 0.1–1.0)
MONOS PCT: 10.9 % (ref 3.0–12.0)
Neutro Abs: 3.4 10*3/uL (ref 1.4–7.7)
Neutrophils Relative %: 60.9 % (ref 43.0–77.0)
Platelets: 266 10*3/uL (ref 150.0–400.0)
RBC: 4.61 Mil/uL (ref 4.22–5.81)
RDW: 15.1 % (ref 11.5–15.5)
WBC: 5.5 10*3/uL (ref 4.0–10.5)

## 2017-03-27 LAB — HEPATIC FUNCTION PANEL
ALK PHOS: 40 U/L (ref 39–117)
ALT: 18 U/L (ref 0–53)
AST: 20 U/L (ref 0–37)
Albumin: 4.3 g/dL (ref 3.5–5.2)
BILIRUBIN TOTAL: 1.4 mg/dL — AB (ref 0.2–1.2)
Bilirubin, Direct: 0.2 mg/dL (ref 0.0–0.3)
Total Protein: 7.1 g/dL (ref 6.0–8.3)

## 2017-03-27 LAB — TROPONIN I: TNIDX: 0.01 ug/l (ref 0.00–0.06)

## 2017-03-27 NOTE — Assessment & Plan Note (Signed)
Atypical, ecg reviewed, d/w pt, for labs including troponin I, cxr, stress testing and cardiology consult

## 2017-03-27 NOTE — Patient Instructions (Signed)
Your EKG was OK today  Please continue all other medications as before, and refills have been done if requested.  Please have the pharmacy call with any other refills you may need.  You will be contacted regarding the referral for: Cardiology  Please keep your appointments with your specialists as you may have planned  Please go to the XRAY Department in the Basement (go straight as you get off the elevator) for the x-ray testing  Please go to the LAB in the Basement (turn left off the elevator) for the tests to be done today  You will be contacted by phone if any changes need to be made immediately.  Otherwise, you will receive a letter about your results with an explanation, but please check with MyChart first.  Please remember to sign up for MyChart if you have not done so, as this will be important to you in the future with finding out test results, communicating by private email, and scheduling acute appointments online when needed.

## 2017-03-27 NOTE — Progress Notes (Signed)
Subjective:    Patient ID: Shane Fischer, male    DOB: 12-26-1940, 77 y.o.   MRN: [id number]  HPI  Here to f/u with c/o anterior chest pressure like sensation simply across the chest maybe somewhat more on the left but without radiation, diaphoresis, n/v, sob, palps or dizziness.  This seems different from his typical reflux like pain, in fact he has been working on low gluten diet resulting in perceived improved constipation, gas, and overall indigestion.  Pain is nonexertional nonpleuritic or positional.  Initially began simply sitting at church service, and has recurred several times in the last few days, each lasting < 30 min.  No prior hx of CV disease Pt denies  increased sob or doe, wheezing, orthopnea, PND, increased LE swelling, or syncope.  No fever, ST, cough.  Has not been to workout at the [employer] in last few days out of prudence. Past Medical History:  Diagnosis Date  . ALLERGIC RHINITIS 06/07/2007  . Allergy   . BENIGN PROSTATIC HYPERTROPHY 06/07/2007  . Cataract   . CELLULITIS/ABSCESS, TRUNK 07/27/2009  . Chronic sinusitis 06/23/2010  . ELEVATED BLOOD PRESSURE WITHOUT DIAGNOSIS OF HYPERTENSION 02/04/2009  . Erectile dysfunction 06/23/2010  . GERD (gastroesophageal reflux disease) 06/17/2016  . Hearing loss    Bilateral does not wear hearing aids  . HYPERLIPIDEMIA 06/07/2007   Diet controlled  . Hypertension    Patient denies  . Melanoma ([place]) 09/12/2011   Right arm, 2006 - Dr Kelle Darting -    . OTITIS MEDIA, ACUTE, RIGHT 02/04/2009  . SINUSITIS- ACUTE-NOS 06/07/2007  . Tick bite 09/18/2015   Past Surgical History:  Procedure Laterality Date  . COLONOSCOPY    . inguinal herniorrhapy     right and left  . MELANOMA EXCISION  2005   right arm  . s/p left shoulder lipoma      reports that he has quit smoking. His smoking use included cigars and pipe. he has never used smokeless tobacco. He reports that he drinks about 3.0 oz of alcohol per week. He reports that he does not use  drugs. family history includes Cancer in his mother; Colon cancer (age of onset: 69) in his mother; Heart attack in his other. No Known Allergies Current Outpatient Medications on File Prior to Visit  Medication Sig Dispense Refill  . aspirin EC 81 MG tablet Take 1 tablet (81 mg total) by mouth daily. 90 tablet 11  . Cetirizine HCl 10 MG TBDP Take 1 tablet by mouth daily.    . meclizine (ANTIVERT) 25 MG tablet Take 0.5-1 tablets (12.5-25 mg total) by mouth 3 (three) times daily as needed for dizziness. 30 tablet 0  . pantoprazole (PROTONIX) 40 MG tablet Take 1 tablet (40 mg total) by mouth daily. 90 tablet 3   No current facility-administered medications on file prior to visit.    Review of Systems  Constitutional: Negative for other unusual diaphoresis or sweats HENT: Negative for ear discharge or swelling Eyes: Negative for other worsening visual disturbances Respiratory: Negative for stridor or other swelling  Gastrointestinal: Negative for worsening distension or other blood Genitourinary: Negative for retention or other urinary change Musculoskeletal: Negative for other MSK pain or swelling Skin: Negative for color change or other new lesions Neurological: Negative for worsening tremors and other numbness  Psychiatric/Behavioral: Negative for worsening agitation or other fatigue All other system neg per pt    Objective:   Physical Exam BP (!) 144/82   Pulse 65   Temp 97.9  F (36.6 C) (Oral)   Ht 5\' 10"  (1.778 m)   Wt 184 lb (83.5 kg)   SpO2 99%   BMI 26.40 kg/m  VS noted,  Constitutional: Pt appears in NAD HENT: Head: NCAT.  Right Ear: External ear normal.  Left Ear: External ear normal.  Eyes: . Pupils are equal, round, and reactive to light. Conjunctivae and EOM are normal Nose: without d/c or deformity Neck: Neck supple. Gross normal ROM Cardiovascular: Normal rate and regular rhythm.   Pulmonary/Chest: Effort normal and breath sounds without rales or wheezing.   Abd:  Soft, NT, ND, + BS, no organomegaly Neurological: Pt is alert. At baseline orientation, motor grossly intact Skin: Skin is warm. No rashes, other new lesions, no LE edema Psychiatric: Pt behavior is normal without agitation  No other exam findings  ECG today I have personally interpreted NSR - no ischemic changes     Assessment & Plan:

## 2017-03-28 ENCOUNTER — Telehealth (HOSPITAL_COMMUNITY): Payer: Self-pay | Admitting: Internal Medicine

## 2017-03-28 ENCOUNTER — Telehealth (HOSPITAL_COMMUNITY): Payer: Self-pay | Admitting: *Deleted

## 2017-03-28 NOTE — Telephone Encounter (Signed)
User: Cherie Dark A Date/time: 03/28/17 9:18 AM  Comment: Called pt and lmsg for him to CB to get scheduled for a nuclear stress test.   Context:  Outcome: Left Message  Phone number: [phone] Phone Type: Home Phone  Comm. type: Telephone Call type: Outgoing  Contact: Almetta Lovely Relation to patient: Self

## 2017-03-28 NOTE — Telephone Encounter (Signed)
Patient given detailed instructions per Myocardial Perfusion Study Information Sheet for the test on 03/29/17 at 9:45. Patient notified to arrive 15 minutes early and that it is imperative to arrive on time for appointment to keep from having the test rescheduled.  If you need to cancel or reschedule your appointment, please call the office within 24 hours of your appointment. . Patient verbalized understanding.Shane Fischer

## 2017-03-29 ENCOUNTER — Ambulatory Visit (HOSPITAL_COMMUNITY): Payer: Medicare Other | Attending: Cardiology

## 2017-03-29 DIAGNOSIS — R0789 Other chest pain: Secondary | ICD-10-CM

## 2017-03-29 DIAGNOSIS — I2510 Atherosclerotic heart disease of native coronary artery without angina pectoris: Secondary | ICD-10-CM | POA: Diagnosis not present

## 2017-03-29 DIAGNOSIS — R079 Chest pain, unspecified: Secondary | ICD-10-CM | POA: Diagnosis not present

## 2017-03-29 DIAGNOSIS — I10 Essential (primary) hypertension: Secondary | ICD-10-CM | POA: Diagnosis not present

## 2017-03-29 LAB — MYOCARDIAL PERFUSION IMAGING
CHL CUP NUCLEAR SRS: 4
CHL CUP NUCLEAR SSS: 6
CHL RATE OF PERCEIVED EXERTION: 18
CSEPED: 6 min
CSEPEW: 7 METS
CSEPHR: 89 %
CSEPPHR: 131 {beats}/min
LV dias vol: 2 mL (ref 62–150)
LV sys vol: 43 mL
MPHR: 146 {beats}/min
RATE: 0.32
Rest HR: 65 {beats}/min
SDS: 4
TID: 1.12

## 2017-03-29 MED ORDER — TECHNETIUM TC 99M TETROFOSMIN IV KIT
10.2000 | PACK | Freq: Once | INTRAVENOUS | Status: AC | PRN
  Administered 2017-03-29: 10.2 via INTRAVENOUS
  Filled 2017-03-29: qty 11

## 2017-03-29 MED ORDER — TECHNETIUM TC 99M TETROFOSMIN IV KIT
32.6000 | PACK | Freq: Once | INTRAVENOUS | Status: AC | PRN
  Administered 2017-03-29: 32.6 via INTRAVENOUS
  Filled 2017-03-29: qty 33

## 2017-04-01 ENCOUNTER — Encounter: Payer: Self-pay | Admitting: Cardiology

## 2017-04-01 NOTE — Progress Notes (Signed)
Cardiology Office Note   Date:  04/03/2017   ID:  Leeam, Cedrone 02-11-1941, MRN [id number]  PCP:  Biagio Borg, MD  Cardiologist:   No primary care provider on file. Referring:  Biagio Borg, MD  Chief Complaint  Patient presents with  . Chest Pain      History of Present Illness: GILMAR BUA is a 77 y.o. male who presents for evaluation of chest pain. He has no past cardiac history. On January 20 he felt a warm sensation across his chest from shoulder to shoulder. He never had this before. He had some GI complaints with a negative EGD in the past though he's been told he had a slight hiatal hernia. He said that every day several times a day he would get the same sensation lasting about 15 minutes. There were no associated symptoms. Would happen at rest. There was no nausea or vomiting He wasn't having diaphoresis. He was not able to bring it on. He had no shortness of breath, PND or orthopnea. He had no palpitations or presyncope. He was sent by Dr. Jenny Reichmann for a [facility] recently and this was negative for evidence of ischemia.  During this test he had no symptoms and since that test on Thursday of last week he has had no further chest pain. He otherwise exercises routinely and has no cardiovascular symptoms.    Past Medical History:  Diagnosis Date  . ALLERGIC RHINITIS 06/07/2007  . BENIGN PROSTATIC HYPERTROPHY 06/07/2007  . Cataract   . CELLULITIS/ABSCESS, TRUNK 07/27/2009  . Chronic sinusitis 06/23/2010  . Erectile dysfunction 06/23/2010  . GERD (gastroesophageal reflux disease) 06/17/2016  . Hearing loss    Bilateral does not wear hearing aids  . HYPERLIPIDEMIA 06/07/2007   Diet controlled  . Hypertension    Patient denies  . Melanoma ([place]) 09/12/2011   Right arm, 2006 - Dr Kelle Darting -    . OTITIS MEDIA, ACUTE, RIGHT 02/04/2009    Past Surgical History:  Procedure Laterality Date  . COLONOSCOPY    . inguinal herniorrhapy     right and left  . MELANOMA  EXCISION  2005   right arm  . s/p left shoulder lipoma       Current Outpatient Medications  Medication Sig Dispense Refill  . aspirin EC 81 MG tablet Take 1 tablet (81 mg total) by mouth daily. 90 tablet 11  . Cetirizine HCl 10 MG TBDP Take 1 tablet by mouth daily.    . pantoprazole (PROTONIX) 40 MG tablet Take 1 tablet (40 mg total) by mouth daily. 90 tablet 3   No current facility-administered medications for this visit.     Allergies:   Patient has no known allergies.    Social History:  The patient  reports that he has quit smoking. His smoking use included cigars and pipe. he has never used smokeless tobacco. He reports that he drinks about 3.0 oz of alcohol per week. He reports that he does not use drugs.   Family History:  The patient's family history includes Cancer in his mother; Colon cancer (age of onset: 66) in his mother; Heart attack in his other; Sudden death (age of onset: 96) in his father.    ROS:  Please see the history of present illness.   Otherwise, review of systems are positive for none.   All other systems are reviewed and negative.    PHYSICAL EXAM: VS:  BP 136/78   Pulse 63  Ht 5\' 11"  (1.803 m)   Wt 181 lb (82.1 kg)   BMI 25.24 kg/m  , BMI Body mass index is 25.24 kg/m. GENERAL:  Well appearing HEENT:  Pupils equal round and reactive, fundi not visualized, oral mucosa unremarkable NECK:  No jugular venous distention, waveform within normal limits, carotid upstroke brisk and symmetric, no bruits, no thyromegaly LYMPHATICS:  No cervical, inguinal adenopathy LUNGS:  Clear to auscultation bilaterally BACK:  No CVA tenderness CHEST:  Unremarkable HEART:  PMI not displaced or sustained,S1 and S2 within normal limits, no S3, no S4, no clicks, no rubs, no murmurs ABD:  Flat, positive bowel sounds normal in frequency in pitch, no bruits, no rebound, no guarding, no midline pulsatile mass, no hepatomegaly, no splenomegaly EXT:  2 plus pulses throughout,  no edema, no cyanosis no clubbing SKIN:  No rashes no nodules NEURO:  Cranial nerves II through XII grossly intact, motor grossly intact throughout PSYCH:  Cognitively intact, oriented to person place and time    EKG:  EKG is not ordered today. The ekg ordered 03/27/16 demonstrates NSR, rate 64, axis WNL, intervals WNL, no acute ST T wave changes.     Recent Labs: 06/19/2016: TSH 0.92 03/27/2017: ALT 18; BUN 16; Creatinine, Ser 0.79; Hemoglobin 15.1; Platelets 266.0; Potassium 3.9; Sodium 138    Lipid Panel    Component Value Date/Time   CHOL 196 06/19/2016 0844   TRIG 38.0 06/19/2016 0844   HDL 70.40 06/19/2016 0844   CHOLHDL 3 06/19/2016 0844   VLDL 7.6 06/19/2016 0844   LDLCALC 118 (H) 06/19/2016 0844   LDLDIRECT 132.6 09/12/2011 1429      Wt Readings from Last 3 Encounters:  04/03/17 181 lb (82.1 kg)  03/29/17 184 lb (83.5 kg)  03/27/17 184 lb (83.5 kg)      Other studies Reviewed: Additional studies/ records that were reviewed today include: Lexiscan Myoview.. Review of the above records demonstrates:  Please see elsewhere in the note.     ASSESSMENT AND PLAN:  CHEST PAIN:  His chest pain is atypical. He had a negative stress perfusion study. His symptoms have resolved. I think the posttest probability of obstructive coronary disease as an etiology is low. I would not suggest further cardiovascular testing.  HTN:  His blood pressure is upper limits. I suspect that time and he knows and I instructed him to keep a blood pressure diary.  DYSLIPIDEMIA:  He has a mildly elevated LDL is HDL is excellent. I agree that no therapy is indicated.   Current medicines are reviewed at length with the patient today.  The patient does not have concerns regarding medicines.  The following changes have been made:  no change  Labs/ tests ordered today include: None No orders of the defined types were placed in this encounter.    Disposition:   FU with me as needed.       Signed, Minus Breeding, MD  04/03/2017 5:55 PM    [place]

## 2017-04-03 ENCOUNTER — Encounter: Payer: Self-pay | Admitting: Cardiology

## 2017-04-03 ENCOUNTER — Ambulatory Visit: Payer: Medicare Other | Admitting: Cardiology

## 2017-04-03 VITALS — BP 136/78 | HR 63 | Ht 71.0 in | Wt 181.0 lb

## 2017-04-03 DIAGNOSIS — R079 Chest pain, unspecified: Secondary | ICD-10-CM | POA: Insufficient documentation

## 2017-04-03 NOTE — Patient Instructions (Signed)
Medication Instructions:  Continue current medications  If you need a refill on your cardiac medications before your next appointment, please call your pharmacy.  Labwork: None Ordered   Testing/Procedures: None ordered  Follow-Up: Your physician wants you to follow-up in: As Needed.      Thank you for choosing [facility]!!       

## 2017-04-16 DIAGNOSIS — H25812 Combined forms of age-related cataract, left eye: Secondary | ICD-10-CM | POA: Diagnosis not present

## 2017-04-16 DIAGNOSIS — H2512 Age-related nuclear cataract, left eye: Secondary | ICD-10-CM | POA: Diagnosis not present

## 2017-04-19 DIAGNOSIS — S0501XA Injury of conjunctiva and corneal abrasion without foreign body, right eye, initial encounter: Secondary | ICD-10-CM | POA: Diagnosis not present

## 2017-04-20 DIAGNOSIS — G43819 Other migraine, intractable, without status migrainosus: Secondary | ICD-10-CM | POA: Diagnosis not present

## 2017-04-20 DIAGNOSIS — H43393 Other vitreous opacities, bilateral: Secondary | ICD-10-CM | POA: Diagnosis not present

## 2017-04-20 DIAGNOSIS — H35373 Puckering of macula, bilateral: Secondary | ICD-10-CM | POA: Diagnosis not present

## 2017-04-23 DIAGNOSIS — H20041 Secondary noninfectious iridocyclitis, right eye: Secondary | ICD-10-CM | POA: Diagnosis not present

## 2017-04-23 DIAGNOSIS — S0502XA Injury of conjunctiva and corneal abrasion without foreign body, left eye, initial encounter: Secondary | ICD-10-CM | POA: Diagnosis not present

## 2017-04-24 DIAGNOSIS — H2511 Age-related nuclear cataract, right eye: Secondary | ICD-10-CM | POA: Diagnosis not present

## 2017-04-30 DIAGNOSIS — H2511 Age-related nuclear cataract, right eye: Secondary | ICD-10-CM | POA: Diagnosis not present

## 2017-04-30 DIAGNOSIS — H25811 Combined forms of age-related cataract, right eye: Secondary | ICD-10-CM | POA: Diagnosis not present

## 2017-07-26 ENCOUNTER — Telehealth: Payer: Self-pay | Admitting: Internal Medicine

## 2017-07-26 MED ORDER — PANTOPRAZOLE SODIUM 40 MG PO TBEC: 40.0000 mg | DELAYED_RELEASE_TABLET | Freq: Every day | ORAL | 3 refills | Status: DC

## 2017-07-26 NOTE — Telephone Encounter (Signed)
Copied from [place] [id number]. Topic: Quick Communication - Rx Refill/Question >> Jul 26, 2017  3:03 PM Selinda Flavin B, NT wrote: Medication: pantoprazole (PROTONIX) 40 MG tablet  Has the patient contacted their pharmacy? Yes.   (Agent: If no, request that the patient contact the pharmacy for the refill.) (Agent: If yes, when and what did the pharmacy advise?)  Preferred Pharmacy (with phone number or street name): [id number] - [place]  Agent: Please be advised that RX refills may take up to 3 business days. We ask that you follow-up with your pharmacy.

## 2017-08-16 DIAGNOSIS — M170 Bilateral primary osteoarthritis of knee: Secondary | ICD-10-CM | POA: Diagnosis not present

## 2017-09-19 ENCOUNTER — Ambulatory Visit (INDEPENDENT_AMBULATORY_CARE_PROVIDER_SITE_OTHER): Payer: Medicare Other | Admitting: Family

## 2017-09-19 ENCOUNTER — Encounter: Payer: Self-pay | Admitting: Family

## 2017-09-19 VITALS — BP 128/66 | HR 76 | Temp 98.4°F | Ht 71.0 in | Wt 186.2 lb

## 2017-09-19 DIAGNOSIS — N451 Epididymitis: Secondary | ICD-10-CM | POA: Diagnosis not present

## 2017-09-19 MED ORDER — DOXYCYCLINE HYCLATE 100 MG PO TABS: 100.0000 mg | ORAL_TABLET | Freq: Two times a day (BID) | ORAL | 0 refills | Status: DC

## 2017-09-19 NOTE — Progress Notes (Signed)
Shane Fischer is a 77 y.o. male with the following history as recorded in EpicCare:  Patient Active Problem List   Diagnosis Date Noted  . Chest pain 04/03/2017  . Chest tightness or pressure 03/27/2017  . Rash 08/10/2016  . Constipation 06/17/2016  . GERD (gastroesophageal reflux disease) 06/17/2016  . Tick bite 09/18/2015  . Prostatitis 07/10/2015  . Vertigo 05/05/2015  . Hematuria 11/05/2012  . Melanoma ([place]) 09/12/2011  . Chronic sinusitis 06/23/2010  . Erectile dysfunction 06/23/2010  . Preventative health care 06/18/2010  . HYPERLIPIDEMIA 06/07/2007  . ALLERGIC RHINITIS 06/07/2007  . BENIGN PROSTATIC HYPERTROPHY 06/07/2007    Current Outpatient Medications  Medication Sig Dispense Refill  . Cetirizine HCl 10 MG TBDP Take 1 tablet by mouth daily.    . pantoprazole (PROTONIX) 40 MG tablet Take 1 tablet (40 mg total) by mouth daily. 90 tablet 3  . doxycycline (VIBRA-TABS) 100 MG tablet Take 1 tablet (100 mg total) by mouth 2 (two) times daily. 28 tablet 0   No current facility-administered medications for this visit.     Allergies: Patient has no known allergies.  Past Medical History:  Diagnosis Date  . ALLERGIC RHINITIS 06/07/2007  . BENIGN PROSTATIC HYPERTROPHY 06/07/2007  . Cataract   . CELLULITIS/ABSCESS, TRUNK 07/27/2009  . Chronic sinusitis 06/23/2010  . Erectile dysfunction 06/23/2010  . GERD (gastroesophageal reflux disease) 06/17/2016  . Hearing loss    Bilateral does not wear hearing aids  . HYPERLIPIDEMIA 06/07/2007   Diet controlled  . Hypertension    Patient denies  . Melanoma ([place]) 09/12/2011   Right arm, 2006 - Dr Kelle Darting -    . OTITIS MEDIA, ACUTE, RIGHT 02/04/2009    Past Surgical History:  Procedure Laterality Date  . COLONOSCOPY    . inguinal herniorrhapy     right and left  . MELANOMA EXCISION  2005   right arm  . s/p left shoulder lipoma      Family History  Problem Relation Age of Onset  . Cancer Mother        colon  . Colon cancer  Mother 67  . Sudden death Father 35  . Heart attack Other   . Stomach cancer Neg Hx   . Esophageal cancer Neg Hx   . Pancreatic cancer Neg Hx     Social History   Tobacco Use  . Smoking status: Former Smoker    Types: Cigars, Pipe  . Smokeless tobacco: Never Used  Substance Use Topics  . Alcohol use: Yes    Alcohol/week: 3.0 oz    Types: 5 Glasses of wine per week    Comment: occ    Subjective:  Patient presents with concerns for possible epididymitis; has been treated in the past and symptoms are similar; symptoms x 1 month but symptoms seem to worsen yesterday; denies any swelling or tenderness to touch- describes as more of a dull ache in the right testicle; denies any burning on urination or increased urgency; denies any blood in the urine;    Objective:  Vitals:   09/19/17 1412  BP: 128/66  Pulse: 76  Temp: 98.4 F (36.9 C)  TempSrc: Oral  SpO2: 97%  Weight: 186 lb 3.2 oz (84.5 kg)  Height: 5\' 11"  (1.803 m)    General: Well developed, well nourished, in no acute distress  Skin : Warm and dry.  Head: Normocephalic and atraumatic  Lungs: Respirations unlabored;  Neurologic: Alert and oriented; speech intact; face symmetrical; moves all extremities well; CNII-XII intact  without focal deficit  Testicular exam- no swelling/ tenderness/ redness  Assessment:  1. Epididymitis     Plan:  Physical exam is normal but patient's history is suspicious; Rx for Doxycyline 100 mg bid x 14 days; follow-up worse, no better.   No follow-ups on file.  No orders of the defined types were placed in this encounter.   Requested Prescriptions   Signed Prescriptions Disp Refills  . doxycycline (VIBRA-TABS) 100 MG tablet 28 tablet 0    Sig: Take 1 tablet (100 mg total) by mouth 2 (two) times daily.

## 2017-09-27 DIAGNOSIS — M1711 Unilateral primary osteoarthritis, right knee: Secondary | ICD-10-CM | POA: Diagnosis not present

## 2017-09-27 DIAGNOSIS — M1712 Unilateral primary osteoarthritis, left knee: Secondary | ICD-10-CM | POA: Diagnosis not present

## 2017-10-10 ENCOUNTER — Ambulatory Visit (INDEPENDENT_AMBULATORY_CARE_PROVIDER_SITE_OTHER): Payer: Medicare Other | Admitting: Family

## 2017-10-10 ENCOUNTER — Other Ambulatory Visit (INDEPENDENT_AMBULATORY_CARE_PROVIDER_SITE_OTHER): Payer: Medicare Other

## 2017-10-10 ENCOUNTER — Encounter: Payer: Self-pay | Admitting: Family

## 2017-10-10 VITALS — BP 128/78 | HR 65 | Temp 97.3°F | Ht 71.0 in | Wt 186.8 lb

## 2017-10-10 DIAGNOSIS — R109 Unspecified abdominal pain: Secondary | ICD-10-CM | POA: Diagnosis not present

## 2017-10-10 DIAGNOSIS — N50811 Right testicular pain: Secondary | ICD-10-CM | POA: Diagnosis not present

## 2017-10-10 LAB — URINALYSIS
Bilirubin Urine: NEGATIVE
Ketones, ur: NEGATIVE
Leukocytes, UA: NEGATIVE
Nitrite: NEGATIVE
Specific Gravity, Urine: 1.02 (ref 1.000–1.030)
Total Protein, Urine: NEGATIVE
Urine Glucose: NEGATIVE
Urobilinogen, UA: 0.2 (ref 0.0–1.0)
pH: 6 (ref 5.0–8.0)

## 2017-10-10 LAB — CBC WITH DIFFERENTIAL/PLATELET
BASOS ABS: 0 10*3/uL (ref 0.0–0.1)
Basophils Relative: 0.8 % (ref 0.0–3.0)
EOS ABS: 0.1 10*3/uL (ref 0.0–0.7)
Eosinophils Relative: 1.2 % (ref 0.0–5.0)
HCT: 44.9 % (ref 39.0–52.0)
Hemoglobin: 15.2 g/dL (ref 13.0–17.0)
Lymphocytes Relative: 26.9 % (ref 12.0–46.0)
Lymphs Abs: 1.5 10*3/uL (ref 0.7–4.0)
MCHC: 33.8 g/dL (ref 30.0–36.0)
MCV: 98.1 fl (ref 78.0–100.0)
MONO ABS: 0.6 10*3/uL (ref 0.1–1.0)
Monocytes Relative: 10.3 % (ref 3.0–12.0)
NEUTROS ABS: 3.5 10*3/uL (ref 1.4–7.7)
NEUTROS PCT: 60.8 % (ref 43.0–77.0)
PLATELETS: 293 10*3/uL (ref 150.0–400.0)
RBC: 4.58 Mil/uL (ref 4.22–5.81)
RDW: 14.3 % (ref 11.5–15.5)
WBC: 5.7 10*3/uL (ref 4.0–10.5)

## 2017-10-10 LAB — COMPREHENSIVE METABOLIC PANEL
ALT: 12 U/L (ref 0–53)
AST: 14 U/L (ref 0–37)
Albumin: 4 g/dL (ref 3.5–5.2)
Alkaline Phosphatase: 38 U/L — ABNORMAL LOW (ref 39–117)
BILIRUBIN TOTAL: 0.7 mg/dL (ref 0.2–1.2)
BUN: 20 mg/dL (ref 6–23)
CO2: 32 mEq/L (ref 19–32)
CREATININE: 0.86 mg/dL (ref 0.40–1.50)
Calcium: 9.2 mg/dL (ref 8.4–10.5)
Chloride: 103 mEq/L (ref 96–112)
GFR: 92.18 mL/min (ref >60.00)
GLUCOSE: 91 mg/dL (ref 70–99)
Potassium: 4.7 mEq/L (ref 3.5–5.1)
SODIUM: 140 meq/L (ref 135–145)
Total Protein: 6.5 g/dL (ref 6.0–8.3)

## 2017-10-10 LAB — PSA: PSA: 0.57 ng/mL (ref 0.10–4.00)

## 2017-10-10 NOTE — Progress Notes (Signed)
Shane Fischer is a 77 y.o. male with the following history as recorded in EpicCare:  Patient Active Problem List   Diagnosis Date Noted  . Chest pain 04/03/2017  . Chest tightness or pressure 03/27/2017  . Rash 08/10/2016  . Constipation 06/17/2016  . GERD (gastroesophageal reflux disease) 06/17/2016  . Tick bite 09/18/2015  . Prostatitis 07/10/2015  . Vertigo 05/05/2015  . Hematuria 11/05/2012  . Melanoma ([place]) 09/12/2011  . Chronic sinusitis 06/23/2010  . Erectile dysfunction 06/23/2010  . Preventative health care 06/18/2010  . HYPERLIPIDEMIA 06/07/2007  . ALLERGIC RHINITIS 06/07/2007  . BENIGN PROSTATIC HYPERTROPHY 06/07/2007    Current Outpatient Medications  Medication Sig Dispense Refill  . Cetirizine HCl 10 MG TBDP Take 1 tablet by mouth daily.    . pantoprazole (PROTONIX) 40 MG tablet Take 1 tablet (40 mg total) by mouth daily. 90 tablet 3   No current facility-administered medications for this visit.     Allergies: Patient has no known allergies.  Past Medical History:  Diagnosis Date  . ALLERGIC RHINITIS 06/07/2007  . BENIGN PROSTATIC HYPERTROPHY 06/07/2007  . Cataract   . CELLULITIS/ABSCESS, TRUNK 07/27/2009  . Chronic sinusitis 06/23/2010  . Erectile dysfunction 06/23/2010  . GERD (gastroesophageal reflux disease) 06/17/2016  . Hearing loss    Bilateral does not wear hearing aids  . HYPERLIPIDEMIA 06/07/2007   Diet controlled  . Hypertension    Patient denies  . Melanoma ([place]) 09/12/2011   Right arm, 2006 - Dr Kelle Darting -    . OTITIS MEDIA, ACUTE, RIGHT 02/04/2009    Past Surgical History:  Procedure Laterality Date  . COLONOSCOPY    . inguinal herniorrhapy     right and left  . MELANOMA EXCISION  2005   right arm  . s/p left shoulder lipoma      Family History  Problem Relation Age of Onset  . Cancer Mother        colon  . Colon cancer Mother 76  . Sudden death Father 94  . Heart attack Other   . Stomach cancer Neg Hx   . Esophageal cancer Neg Hx    . Pancreatic cancer Neg Hx     Social History   Tobacco Use  . Smoking status: Former Smoker    Types: Cigars, Pipe  . Smokeless tobacco: Never Used  Substance Use Topics  . Alcohol use: Yes    Alcohol/week: 3.0 oz    Types: 5 Glasses of wine per week    Comment: occ    Subjective:  Patient presents with concerns for persisting right-sided testicular pain; seen for this approximately 3 weeks ago and opted to treat for atypical presentation of epididymitis based on patient's history; he notes he saw very little change in his symptoms while on the antibiotics; continues to experience a "dull ache" in the right testicle; no swelling noted; no penile discharge; physical exam at last OV was unremarkable; denies any urinary changes.    Objective:  Vitals:   10/10/17 1540  BP: 128/78  Pulse: 65  Temp: (!) 97.3 F (36.3 C)  TempSrc: Oral  SpO2: 95%  Weight: 186 lb 12.8 oz (84.7 kg)  Height: _0  (1.803 m)    General: Well developed, well nourished, in no acute distress  Skin : Warm and dry.  Head: Normocephalic and atraumatic  Lungs: Respirations unlabored;  Neurologic: Alert and oriented; speech intact; face symmetrical; moves all extremities well; CNII-XII intact without focal deficit  Testicular exam deferred today;  Assessment:  1. Testicular pain, right   2. Abdominal pain, unspecified abdominal location     Plan:  Update labs and testicular ultrasound today; if ultrasound is normal, will need to consider abd/ pelvic CT to rule out inguinal hernia; follow-up to be determined.   No follow-ups on file.  Orders Placed This Encounter  Procedures  . US Scrotum    Standing Status:   Future    Standing Expiration Date:   12/11/2018    Order Specific Question:   Reason for Exam (SYMPTOM  OR DIAGNOSIS REQUIRED)    Answer:   testicular pain x 1 month    Order Specific Question:   Preferred imaging location?    Answer:   [facility]  . CBC w/Diff    Standing  Status:   Future    Number of Occurrences:   1    Standing Expiration Date:   10/10/2018  . Comp Met (CMET)    Standing Status:   Future    Number of Occurrences:   1    Standing Expiration Date:   10/10/2018  . PSA    Standing Status:   Future    Number of Occurrences:   1    Standing Expiration Date:   10/10/2018  . Urinalysis    Standing Status:   Future    Number of Occurrences:   1    Standing Expiration Date:   10/10/2018    Requested Prescriptions    No prescriptions requested or ordered in this encounter

## 2017-10-11 ENCOUNTER — Ambulatory Visit: Payer: Medicare Other | Admitting: Family

## 2017-10-12 ENCOUNTER — Ambulatory Visit
Admission: RE | Admit: 2017-10-12 | Discharge: 2017-10-12 | Disposition: A | Discharge status: Home / Self Care | Payer: Medicare Other | Source: Ambulatory Visit | Attending: Family | Admitting: Family

## 2017-10-12 ENCOUNTER — Other Ambulatory Visit: Payer: Medicare Other

## 2017-10-12 DIAGNOSIS — N50811 Right testicular pain: Secondary | ICD-10-CM

## 2017-10-12 DIAGNOSIS — N503 Cyst of epididymis: Secondary | ICD-10-CM | POA: Diagnosis not present

## 2017-11-09 DIAGNOSIS — M25572 Pain in left ankle and joints of left foot: Secondary | ICD-10-CM | POA: Diagnosis not present

## 2017-11-09 DIAGNOSIS — M13862 Other specified arthritis, left knee: Secondary | ICD-10-CM | POA: Diagnosis not present

## 2017-11-09 DIAGNOSIS — M7662 Achilles tendinitis, left leg: Secondary | ICD-10-CM | POA: Diagnosis not present

## 2017-11-09 DIAGNOSIS — M13861 Other specified arthritis, right knee: Secondary | ICD-10-CM | POA: Diagnosis not present

## 2017-12-14 DIAGNOSIS — H26493 Other secondary cataract, bilateral: Secondary | ICD-10-CM | POA: Diagnosis not present

## 2017-12-14 DIAGNOSIS — H35373 Puckering of macula, bilateral: Secondary | ICD-10-CM | POA: Diagnosis not present

## 2017-12-14 DIAGNOSIS — H524 Presbyopia: Secondary | ICD-10-CM | POA: Diagnosis not present

## 2017-12-14 DIAGNOSIS — H43813 Vitreous degeneration, bilateral: Secondary | ICD-10-CM | POA: Diagnosis not present

## 2017-12-14 DIAGNOSIS — Z961 Presence of intraocular lens: Secondary | ICD-10-CM | POA: Diagnosis not present

## 2018-01-24 ENCOUNTER — Ambulatory Visit (INDEPENDENT_AMBULATORY_CARE_PROVIDER_SITE_OTHER): Payer: Medicare Other | Admitting: *Deleted

## 2018-01-24 VITALS — BP 138/74 | HR 63 | Resp 17 | Ht 71.0 in | Wt 190.0 lb

## 2018-01-24 DIAGNOSIS — Z23 Encounter for immunization: Secondary | ICD-10-CM

## 2018-01-24 DIAGNOSIS — H900 Conductive hearing loss, bilateral: Secondary | ICD-10-CM

## 2018-01-24 DIAGNOSIS — Z0000 Encounter for general adult medical examination without abnormal findings: Secondary | ICD-10-CM | POA: Diagnosis not present

## 2018-01-24 DIAGNOSIS — Z1211 Encounter for screening for malignant neoplasm of colon: Secondary | ICD-10-CM

## 2018-01-24 NOTE — Patient Instructions (Addendum)
Continue doing brain stimulating activities (puzzles, reading, adult coloring books, staying active) to keep memory sharp.   Continue to eat heart healthy diet (full of fruits, vegetables, whole grains, lean protein, water--limit salt, fat, and sugar intake) and increase physical activity as tolerated.   Shane Fischer , Thank you for taking time to come for your Medicare Wellness Visit. I appreciate your ongoing commitment to your health goals. Please review the following plan we discussed and let me know if I can assist you in the future.   These are the goals we discussed: Goals    . Maintain current health status     Continue to eat healthy and exercise, enjoy life and family.     . Patient Stated     Keep writing and complete my book. Love family and enjoy life.       This is a list of the screening recommended for you and due dates:  Health Maintenance  Topic Date Due  . Flu Shot  10/04/2017  . Colon Cancer Screening  02/22/2018  . Tetanus Vaccine  11/19/2023  . Pneumonia vaccines  Completed    Health Maintenance, Male A healthy lifestyle and preventive care is important for your health and wellness. Ask your health care provider about what schedule of regular examinations is right for you. What should I know about weight and diet? Eat a Healthy Diet  Eat plenty of vegetables, fruits, whole grains, low-fat dairy products, and lean protein.  Do not eat a lot of foods high in solid fats, added sugars, or salt.  Maintain a Healthy Weight Regular exercise can help you achieve or maintain a healthy weight. You should:  Do at least 150 minutes of exercise each week. The exercise should increase your heart rate and make you sweat (moderate-intensity exercise).  Do strength-training exercises at least twice a week.  Watch Your Levels of Cholesterol and Blood Lipids  Have your blood tested for lipids and cholesterol every 5 years starting at 74 years of age. If you are at high  risk for heart disease, you should start having your blood tested when you are 75 years old. You may need to have your cholesterol levels checked more often if: ? Your lipid or cholesterol levels are high. ? You are older than 75 years of age. ? You are at high risk for heart disease.  What should I know about cancer screening? Many types of cancers can be detected early and may often be prevented. Lung Cancer  You should be screened every year for lung cancer if: ? You are a current smoker who has smoked for at least 30 years. ? You are a former smoker who has quit within the past 15 years.  Talk to your health care provider about your screening options, when you should start screening, and how often you should be screened.  Colorectal Cancer  Routine colorectal cancer screening usually begins at 75 years of age and should be repeated every 5-10 years until you are 75 years old. You may need to be screened more often if early forms of precancerous polyps or small growths are found. Your health care provider may recommend screening at an earlier age if you have risk factors for colon cancer.  Your health care provider may recommend using home test kits to check for hidden blood in the stool.  A small camera at the end of a tube can be used to examine your colon (sigmoidoscopy or colonoscopy). This checks for  the earliest forms of colorectal cancer.  Prostate and Testicular Cancer  Depending on your age and overall health, your health care provider may do certain tests to screen for prostate and testicular cancer.  Talk to your health care provider about any symptoms or concerns you have about testicular or prostate cancer.  Skin Cancer  Check your skin from head to toe regularly.  Tell your health care provider about any new moles or changes in moles, especially if: ? There is a change in a mole's size, shape, or color. ? You have a mole that is larger than a pencil  eraser.  Always use sunscreen. Apply sunscreen liberally and repeat throughout the day.  Protect yourself by wearing long sleeves, pants, a wide-brimmed hat, and sunglasses when outside.  What should I know about heart disease, diabetes, and high blood pressure?  If you are 80-26 years of age, have your blood pressure checked every 3-5 years. If you are 62 years of age or older, have your blood pressure checked every year. You should have your blood pressure measured twice-once when you are at a hospital or clinic, and once when you are not at a hospital or clinic. Record the average of the two measurements. To check your blood pressure when you are not at a hospital or clinic, you can use: ? An automated blood pressure machine at a pharmacy. ? A home blood pressure monitor.  Talk to your health care provider about your target blood pressure.  If you are between 55-57 years old, ask your health care provider if you should take aspirin to prevent heart disease.  Have regular diabetes screenings by checking your fasting blood sugar level. ? If you are at a normal weight and have a low risk for diabetes, have this test once every three years after the age of 11. ? If you are overweight and have a high risk for diabetes, consider being tested at a younger age or more often.  A one-time screening for abdominal aortic aneurysm (AAA) by ultrasound is recommended for men aged 41-75 years who are current or former smokers. What should I know about preventing infection? Hepatitis B If you have a higher risk for hepatitis B, you should be screened for this virus. Talk with your health care provider to find out if you are at risk for hepatitis B infection. Hepatitis C Blood testing is recommended for:  Everyone born from [birth year] through 1963.  Anyone with known risk factors for hepatitis C.  Sexually Transmitted Diseases (STDs)  You should be screened each year for STDs including gonorrhea and  chlamydia if: ? You are sexually active and are younger than 75 years of age. ? You are older than 75 years of age and your health care provider tells you that you are at risk for this type of infection. ? Your sexual activity has changed since you were last screened and you are at an increased risk for chlamydia or gonorrhea. Ask your health care provider if you are at risk.  Talk with your health care provider about whether you are at high risk of being infected with HIV. Your health care provider may recommend a prescription medicine to help prevent HIV infection.  What else can I do?  Schedule regular health, dental, and eye exams.  Stay current with your vaccines (immunizations).  Do not use any tobacco products, such as cigarettes, chewing tobacco, and e-cigarettes. If you need help quitting, ask your health care provider.  Limit  alcohol intake to no more than 2 drinks per day. One drink equals 12 ounces of beer, 5 ounces of , or 1 ounces of hard liquor.  Do not use street drugs.  Do not share needles.  Ask your health care provider for help if you need support or information about quitting drugs.  Tell your health care provider if you often feel depressed.  Tell your health care provider if you have ever been abused or do not feel safe at home. This information is not intended to replace advice given to you by your health care provider. Make sure you discuss any questions you have with your health care provider. Document Released: 08/19/2007 Document Revised: 10/20/2015 Document Reviewed: 11/24/2014 Elsevier Interactive Patient Education  2018 [employer].  Influenza Virus Vaccine injection What is this medicine? INFLUENZA VIRUS VACCINE (in floo EN zuh VAHY ruhs vak SEEN) helps to reduce the risk of getting influenza also known as the flu. The vaccine only helps protect you against some strains of the flu. This medicine may be used for other purposes; ask your health  care provider or pharmacist if you have questions. COMMON BRAND NAME(S): Afluria, Agriflu, Alfuria, FLUAD, Fluarix, Fluarix Quadrivalent, Flublok, Flublok Quadrivalent, FLUCELVAX, Flulaval, Fluvirin, Fluzone, Fluzone High-Dose, Fluzone Intradermal What should I tell my health care provider before I take this medicine? They need to know if you have any of these conditions: -bleeding disorder like hemophilia -fever or infection -Guillain-Barre syndrome or other neurological problems -immune system problems -infection with the human immunodeficiency virus (HIV) or AIDS -low blood platelet counts -multiple sclerosis -an unusual or allergic reaction to influenza virus vaccine, latex, other medicines, foods, dyes, or preservatives. Different brands of vaccines contain different allergens. Some may contain latex or eggs. Talk to your doctor about your allergies to make sure that you get the right vaccine. -pregnant or trying to get pregnant -breast-feeding How should I use this medicine? This vaccine is for injection into a muscle or under the skin. It is given by a health care professional. A copy of Vaccine Information Statements will be given before each vaccination. Read this sheet carefully each time. The sheet may change frequently. Talk to your healthcare provider to see which vaccines are right for you. Some vaccines should not be used in all age groups. Overdosage: If you think you have taken too much of this medicine contact a poison control center or emergency room at once. NOTE: This medicine is only for you. Do not share this medicine with others. What if I miss a dose? This does not apply. What may interact with this medicine? -chemotherapy or radiation therapy -medicines that lower your immune system like etanercept, anakinra, infliximab, and adalimumab -medicines that treat or prevent blood clots like warfarin -phenytoin -steroid medicines like prednisone or  cortisone -theophylline -vaccines This list may not describe all possible interactions. Give your health care provider a list of all the medicines, herbs, non-prescription drugs, or dietary supplements you use. Also tell them if you smoke, drink alcohol, or use illegal drugs. Some items may interact with your medicine. What should I watch for while using this medicine? Report any side effects that do not go away within 3 days to your doctor or health care professional. Call your health care provider if any unusual symptoms occur within 6 weeks of receiving this vaccine. You may still catch the flu, but the illness is not usually as bad. You cannot get the flu from the vaccine. The vaccine will not  protect against colds or other illnesses that may cause fever. The vaccine is needed every year. What side effects may I notice from receiving this medicine? Side effects that you should report to your doctor or health care professional as soon as possible: -allergic reactions like skin rash, itching or hives, swelling of the face, lips, or tongue Side effects that usually do not require medical attention (report to your doctor or health care professional if they continue or are bothersome): -fever -headache -muscle aches and pains -pain, tenderness, redness, or swelling at the injection site -tiredness This list may not describe all possible side effects. Call your doctor for medical advice about side effects. You may report side effects to FDA at 1-800-FDA-1088. Where should I keep my medicine? The vaccine will be given by a health care professional in a clinic, pharmacy, doctor's office, or other health care setting. You will not be given vaccine doses to store at home. NOTE: This sheet is a summary. It may not cover all possible information. If you have questions about this medicine, talk to your doctor, pharmacist, or health care provider.  2018 Elsevier/Gold Standard (2014-09-11 10:07:28)

## 2018-01-24 NOTE — Progress Notes (Addendum)
Subjective:   Shane Fischer is a 77 y.o. male who presents for Medicare Annual/Subsequent preventive examination.  Review of Systems:  No ROS.  Medicare Wellness Visit. Additional risk factors are reflected in the social history.  Cardiac Risk Factors include: advanced age (>30men, >42 women);dyslipidemia;hypertension;male gender Sleep patterns: feels rested on waking, gets up 1-2 times nightly to void and sleeps 7 hours nightly.    Home Safety/Smoke Alarms: Feels safe in home. Smoke alarms in place.  Living environment; residence and Firearm Safety: 1-story house/ trailer, no firearms. Lives alone, no needs for DME, good support system Seat Belt Safety/Bike Helmet: Wears seat belt.   PSA-  Lab Results  Component Value Date   PSA 0.57 10/10/2017   PSA 0.67 06/19/2016   PSA 0.41 11/17/2014       Objective:    Vitals: BP 138/74   Pulse 63   Resp 17   Ht 5\' 11"  (1.803 m)   Wt 190 lb (86.2 kg)   SpO2 99%   BMI 26.50 kg/m   Body mass index is 26.5 kg/m.  Advanced Directives 01/24/2018 09/14/2016 02/23/2015 02/09/2015  Does Patient Have a Medical Advance Directive? Yes Yes Yes Yes  Type of Paramedic of [place];Living will [place];Living will - [place];Living will  Does patient want to make changes to medical advance directive? - - - No - Patient declined  Copy of [place] in Chart? No - copy requested No - copy requested No - copy requested No - copy requested    Tobacco Social History   Tobacco Use  Smoking Status Former Smoker  . Types: Cigars, Pipe  Smokeless Tobacco Never Used     Counseling given: Not Answered  Past Medical History:  Diagnosis Date  . ALLERGIC RHINITIS 06/07/2007  . BENIGN PROSTATIC HYPERTROPHY 06/07/2007  . Cataract   . CELLULITIS/ABSCESS, TRUNK 07/27/2009  . Chronic sinusitis 06/23/2010  . Erectile dysfunction 06/23/2010  . GERD (gastroesophageal reflux  disease) 06/17/2016  . Hearing loss    Bilateral does not wear hearing aids  . HYPERLIPIDEMIA 06/07/2007   Diet controlled  . Melanoma ([place]) 09/12/2011   Right arm, 2006 - Dr Kelle Darting -    . OTITIS MEDIA, ACUTE, RIGHT 02/04/2009   Past Surgical History:  Procedure Laterality Date  . COLONOSCOPY    . inguinal herniorrhapy     right and left  . MELANOMA EXCISION  2005   right arm  . s/p left shoulder lipoma     Family History  Problem Relation Age of Onset  . Cancer Mother        colon  . Colon cancer Mother 64  . Sudden death Father 35  . Heart attack Other   . Stomach cancer Neg Hx   . Esophageal cancer Neg Hx   . Pancreatic cancer Neg Hx    Social History   Socioeconomic History  . Marital status: Widowed    Spouse name: Not on file  . Number of children: 3  . Years of education: Not on file  . Highest education level: Not on file  Occupational History  . Occupation: semi-retired now Theme park manager  Social Needs  . Financial resource strain: Not hard at all  . Food insecurity:    Worry: Never true    Inability: Never true  . Transportation needs:    Medical: No    Non-medical: No  Tobacco Use  . Smoking status: Former Smoker  Types: Cigars, Pipe  . Smokeless tobacco: Never Used  Substance and Sexual Activity  . Alcohol use: Yes    Alcohol/week: 5.0 standard drinks    Types: 5 Glasses of Martavius Lusty per week    Comment: occ  . Drug use: No  . Sexual activity: Not Currently  Lifestyle  . Physical activity:    Days per week: 4 days    Minutes per session: 40 min  . Stress: Not at all  Relationships  . Social connections:    Talks on phone: More than three times a week    Gets together: More than three times a week    Attends religious service: More than 4 times per year    Active member of club or organization: Yes    Attends meetings of clubs or organizations: More than 4 times per year    Relationship status: Widowed  Other Topics Concern  . Not on file  Social  History Narrative  . Not on file    Outpatient Encounter Medications as of 01/24/2018  Medication Sig  . Cetirizine HCl 10 MG TBDP Take 1 tablet by mouth daily.  . Naproxen Sodium (ALEVE) 220 MG CAPS Aleve  prn  . pantoprazole (PROTONIX) 40 MG tablet Take 1 tablet (40 mg total) by mouth daily.   No facility-administered encounter medications on file as of 01/24/2018.     Activities of Daily Living In your present state of health, do you have any difficulty performing the following activities: 01/24/2018  Hearing? Y  Vision? N  Difficulty concentrating or making decisions? N  Walking or climbing stairs? N  Dressing or bathing? N  Doing errands, shopping? N  Preparing Food and eating ? N  Using the Toilet? N  In the past six months, have you accidently leaked urine? N  Do you have problems with loss of bowel control? N  Managing your Medications? N  Managing your Finances? N  Housekeeping or managing your Housekeeping? N  Some recent data might be hidden    Patient Care Team: Biagio Borg, MD as PCP - General Bernardo Heater, Ronda Fairly, MD (Urology) Milus Banister, MD as Attending Physician (Gastroenterology)   Assessment:   This is a routine wellness examination for Shane Fischer. Physical assessment deferred to PCP.   Exercise Activities and Dietary recommendations Current Exercise Habits: Home exercise routine, Type of exercise: walking, Time (Minutes): 45, Frequency (Times/Week): 5, Weekly Exercise (Minutes/Week): 225, Intensity: Mild  Diet (meal preparation, eat out, water intake, caffeinated beverages, dairy products, fruits and vegetables): in general, a "healthy" diet  , well balanced. eats a variety of fruits and vegetables daily, limits salt, fat/cholesterol, sugar,carbohydrates,caffeine, drinks 6-8 glasses of water daily.  Goals    . Maintain current health status     Continue to eat healthy and exercise, enjoy life and family.     . Patient Stated     Keep writing and  complete my book. Love family and enjoy life.       Fall Risk Fall Risk  01/24/2018 10/10/2017 09/14/2016 06/15/2016 11/20/2014  Falls in the past year? 1 No No No No  Number falls in past yr: 0 - - - -  Injury with Fall? 0 - - - -  Risk for fall due to : Impaired mobility - - - -  Risk for fall due to: Comment chronic knee and ankle pain - - - -   Depression Screen PHQ 2/9 Scores 01/24/2018 09/14/2016 06/15/2016 11/20/2014  PHQ - 2  Score 1 0 0 1  PHQ- 9 Score 1 - - -    Cognitive Function MMSE - Mini Mental State Exam 01/24/2018  Not completed: Refused       Ad8 score reviewed for issues:  Issues making decisions: no  Less interest in hobbies / activities: no  Repeats questions, stories (family complaining): no  Trouble using ordinary gadgets (microwave, computer, phone):no  Forgets the month or year: no  Mismanaging finances: no  Remembering appts: no  Daily problems with thinking and/or memory: no Ad8 score is= 0  Immunization History  Administered Date(s) Administered  . H1N1 05/11/2008  . Influenza Whole 12/05/2007  . Influenza, High Dose Seasonal PF 03/28/2016, 12/29/2016, 01/24/2018  . Influenza, Seasonal, Injecte, Preservative Fre 02/07/2012  . Influenza,inj,Quad PF,6+ Mos 11/05/2012, 11/18/2013, 11/20/2014  . Pneumococcal Conjugate-13 12/25/2014  . Pneumococcal Polysaccharide-23 05/11/2008  . Td 05/11/2008  . Tdap 11/18/2013  . Zoster 11/18/2013   Screening Tests Health Maintenance  Topic Date Due  . INFLUENZA VACCINE  10/04/2017  . COLONOSCOPY  02/22/2018  . TETANUS/TDAP  11/19/2023  . PNA vac Low Risk Adult  Completed      Plan:     Continue doing brain stimulating activities (puzzles, reading, adult coloring books, staying active) to keep memory sharp.   Continue to eat heart healthy diet (full of fruits, vegetables, whole grains, lean protein, water--limit salt, fat, and sugar intake) and increase physical activity as tolerated.  I have  personally reviewed and noted the following in the patient's chart:   . Medical and social history . Use of alcohol, tobacco or illicit drugs  . Current medications and supplements . Functional ability and status . Nutritional status . Physical activity . Advanced directives . List of other physicians . Vitals . Screenings to include cognitive, depression, and falls . Referrals and appointments  In addition, I have reviewed and discussed with patient certain preventive protocols, quality metrics, and best practice recommendations. A written personalized care plan for preventive services as well as general preventive health recommendations were provided to patient.     Michiel Cowboy, RN  01/24/2018  Medical screening examination/treatment/procedure(s) were performed by non-physician practitioner and as supervising physician I was immediately available for consultation/collaboration. I agree with above. Cathlean Cower, MD

## 2018-02-11 ENCOUNTER — Other Ambulatory Visit: Payer: Self-pay | Admitting: Internal Medicine

## 2018-02-11 DIAGNOSIS — H9193 Unspecified hearing loss, bilateral: Secondary | ICD-10-CM

## 2018-02-18 ENCOUNTER — Encounter: Payer: Self-pay | Admitting: Gastroenterology

## 2018-03-13 ENCOUNTER — Encounter: Payer: Self-pay | Admitting: Gastroenterology

## 2018-03-13 ENCOUNTER — Ambulatory Visit (AMBULATORY_SURGERY_CENTER): Payer: Self-pay | Admitting: *Deleted

## 2018-03-13 VITALS — Ht 70.5 in | Wt 190.0 lb

## 2018-03-13 DIAGNOSIS — Z86010 Personal history of colonic polyps: Secondary | ICD-10-CM

## 2018-03-13 DIAGNOSIS — Z800 Family history of malignant neoplasm of digestive organs: Secondary | ICD-10-CM

## 2018-03-13 MED ORDER — NA SULFATE-K SULFATE-MG SULF 17.5-3.13-1.6 GM/177ML PO SOLN: 1.0000 | Freq: Once | ORAL | 0 refills | Status: AC

## 2018-03-13 NOTE — Progress Notes (Signed)
No egg or soy allergy known to patient  No issues with past sedation with any surgeries  or procedures, no intubation problems  No diet pills per patient No home 02 use per patient  No blood thinners per patient  Pt denies issues with constipation  No A fib or A flutter  EMMI video sent to pt's e mail - pt declined  Pt wishes to pay for Suprep instead of drinking Golytely-  I did give pt a PNM $50 Suprep cupon to help with cost but he said he will pay what ever to not drink Golytely

## 2018-03-14 ENCOUNTER — Telehealth: Payer: Self-pay | Admitting: Gastroenterology

## 2018-03-14 NOTE — Telephone Encounter (Signed)
Pt has upcoming colonoscopy 1.22.20 w Dr,. Ardis Hughs.  Pt has questions about diet before procedure and would like a CB.

## 2018-03-14 NOTE — Telephone Encounter (Signed)
In the 5 days before,  Should he avoid dried figs, blueberries and nuts. Instructed, yes. Avoid those as well.

## 2018-03-27 ENCOUNTER — Encounter: Payer: Self-pay | Admitting: Gastroenterology

## 2018-03-27 ENCOUNTER — Ambulatory Visit (AMBULATORY_SURGERY_CENTER): Payer: Medicare Other | Admitting: Gastroenterology

## 2018-03-27 VITALS — BP 121/68 | HR 58 | Temp 97.5°F | Resp 12 | Ht 70.5 in | Wt 190.0 lb

## 2018-03-27 DIAGNOSIS — Z86010 Personal history of colonic polyps: Secondary | ICD-10-CM | POA: Diagnosis not present

## 2018-03-27 DIAGNOSIS — D123 Benign neoplasm of transverse colon: Secondary | ICD-10-CM

## 2018-03-27 DIAGNOSIS — K5730 Diverticulosis of large intestine without perforation or abscess without bleeding: Secondary | ICD-10-CM

## 2018-03-27 MED ORDER — SODIUM CHLORIDE 0.9 % IV SOLN: 500.0000 mL | Freq: Once | INTRAVENOUS | Status: DC

## 2018-03-27 NOTE — Op Note (Signed)
[place] Patient Name: Shane Fischer Procedure Date: 03/27/2018 9:31 AM MRN: [id number] Endoscopist: Milus Banister , MD Age: 78 Referring MD:  Date of Birth: Jul 03, 1940 Gender: Male Account #: [id number] Procedure:                Colonoscopy Indications:              High risk colon cancer surveillance: Personal                            history of colonic polyps; colonoscopy 2016 four                            subCM adenomas Medicines:                Monitored Anesthesia Care Procedure:                Pre-Anesthesia Assessment:                           - Prior to the procedure, a History and Physical                            was performed, and patient medications and                            allergies were reviewed. The patient's tolerance of                            previous anesthesia was also reviewed. The risks                            and benefits of the procedure and the sedation                            options and risks were discussed with the patient.                            All questions were answered, and informed consent                            was obtained. Prior Anticoagulants: The patient has                            taken no previous anticoagulant or antiplatelet                            agents. ASA Grade Assessment: II - A patient with                            mild systemic disease. After reviewing the risks                            and benefits, the patient was deemed in  satisfactory condition to undergo the procedure.                           After obtaining informed consent, the colonoscope                            was passed under direct vision. Throughout the                            procedure, the patient's blood pressure, pulse, and                            oxygen saturations were monitored continuously. The                            Colonoscope was introduced through the anus and                          advanced to the the cecum, identified by                            appendiceal orifice and ileocecal valve. The                            colonoscopy was performed without difficulty. The                            patient tolerated the procedure well. The quality                            of the bowel preparation was good. The ileocecal                            valve, appendiceal orifice, and rectum were                            photographed. Scope In: 9:40:38 AM Scope Out: 9:53:05 AM Scope Withdrawal Time: 0 hours 9 minutes 5 seconds  Total Procedure Duration: 0 hours 12 minutes 27 seconds  Findings:                 A 5 mm polyp was found in the transverse colon. The                            polyp was sessile. The polyp was removed with a                            cold snare. Resection and retrieval were complete.                           Multiple small and large-mouthed diverticula were                            found in the left colon.  The exam was otherwise without abnormality on                            direct and retroflexion views. Complications:            No immediate complications. Estimated blood loss:                            None. Estimated Blood Loss:     Estimated blood loss: none. Impression:               - One 5 mm polyp in the transverse colon, removed                            with a cold snare. Resected and retrieved.                           - Diverticulosis in the left colon.                           - The examination was otherwise normal on direct                            and retroflexion views. Recommendation:           - Patient has a contact number available for                            emergencies. The signs and symptoms of potential                            delayed complications were discussed with the                            patient. Return to normal activities tomorrow.                             Written discharge instructions were provided to the                            patient.                           - Resume previous diet.                           - Continue present medications.                           You will receive a letter within 2-3 weeks with the                            pathology results and my final recommendations.                           If the polyp(s) is proven to be 'pre-cancerous' on  pathology, you will need repeat colonoscopy in 5                            years. Milus Banister, MD 03/27/2018 9:56:34 AM This report has been signed electronically.

## 2018-03-27 NOTE — Progress Notes (Signed)
Called to room to assist during endoscopic procedure.  Patient ID and intended procedure confirmed with present staff. Received instructions for my participation in the procedure from the performing physician.  

## 2018-03-27 NOTE — Progress Notes (Signed)
PT taken to PACU. Monitors in place. VSS. Report given to RN. 

## 2018-03-27 NOTE — Progress Notes (Signed)
Pt's states no medical or surgical changes since previsit or office visit. 

## 2018-03-27 NOTE — Patient Instructions (Signed)
Discharge instructions given. Handouts on polyps and diverticulosis. Resume previous medications. YOU HAD AN ENDOSCOPIC PROCEDURE TODAY AT THE [facility]:   Refer to the procedure report that was given to you for any specific questions about what was found during the examination.  If the procedure report does not answer your questions, please call your gastroenterologist to clarify.  If you requested that your care partner not be given the details of your procedure findings, then the procedure report has been included in a sealed envelope for you to review at your convenience later.  YOU SHOULD EXPECT: Some feelings of bloating in the abdomen. Passage of more gas than usual.  Walking can help get rid of the air that was put into your GI tract during the procedure and reduce the bloating. If you had a lower endoscopy (such as a colonoscopy or flexible sigmoidoscopy) you may notice spotting of blood in your stool or on the toilet paper. If you underwent a bowel prep for your procedure, you may not have a normal bowel movement for a few days.  Please Note:  You might notice some irritation and congestion in your nose or some drainage.  This is from the oxygen used during your procedure.  There is no need for concern and it should clear up in a day or so.  SYMPTOMS TO REPORT IMMEDIATELY:   Following lower endoscopy (colonoscopy or flexible sigmoidoscopy):  Excessive amounts of blood in the stool  Significant tenderness or worsening of abdominal pains  Swelling of the abdomen that is new, acute  Fever of 100F or higher   For urgent or emergent issues, a gastroenterologist can be reached at any hour by calling [phone].   DIET:  We do recommend a small meal at first, but then you may proceed to your regular diet.  Drink plenty of fluids but you should avoid alcoholic beverages for 24 hours.  ACTIVITY:  You should plan to take it easy for the rest of today and you should NOT  DRIVE or use heavy machinery until tomorrow (because of the sedation medicines used during the test).    FOLLOW UP: Our staff will call the number listed on your records the next business day following your procedure to check on you and address any questions or concerns that you may have regarding the information given to you following your procedure. If we do not reach you, we will leave a message.  However, if you are feeling well and you are not experiencing any problems, there is no need to return our call.  We will assume that you have returned to your regular daily activities without incident.  If any biopsies were taken you will be contacted by phone or by letter within the next 1-3 weeks.  Please call us at [phone] if you have not heard about the biopsies in 3 weeks.    SIGNATURES/CONFIDENTIALITY: You and/or your care partner have signed paperwork which will be entered into your electronic medical record.  These signatures attest to the fact that that the information above on your After Visit Summary has been reviewed and is understood.  Full responsibility of the confidentiality of this discharge information lies with you and/or your care-partner. 

## 2018-03-28 ENCOUNTER — Telehealth: Payer: Self-pay

## 2018-03-28 NOTE — Telephone Encounter (Signed)
  Follow up Call-  Call back number 03/27/2018 02/02/2017  Post procedure Call Back phone  # [phone]  Permission to leave phone message Yes Yes  Some recent data might be hidden     Patient questions:  Do you have a fever, pain , or abdominal swelling? No. Pain Score  0 *  Have you tolerated food without any problems? Yes.    Have you been able to return to your normal activities? Yes.    Do you have any questions about your discharge instructions: Diet   No. Medications  No. Follow up visit  No.  Do you have questions or concerns about your Care? No.  Actions: * If pain score is 4 or above: No action needed, pain <4.

## 2018-04-01 ENCOUNTER — Encounter: Payer: Self-pay | Admitting: Gastroenterology

## 2018-04-01 DIAGNOSIS — H906 Mixed conductive and sensorineural hearing loss, bilateral: Secondary | ICD-10-CM | POA: Diagnosis not present

## 2018-05-14 DIAGNOSIS — H903 Sensorineural hearing loss, bilateral: Secondary | ICD-10-CM | POA: Diagnosis not present

## 2018-05-14 DIAGNOSIS — H6982 Other specified disorders of Eustachian tube, left ear: Secondary | ICD-10-CM | POA: Diagnosis not present

## 2018-07-24 ENCOUNTER — Telehealth: Payer: Self-pay | Admitting: Internal Medicine

## 2018-07-24 MED ORDER — PANTOPRAZOLE SODIUM 40 MG PO TBEC: 40.0000 mg | DELAYED_RELEASE_TABLET | Freq: Every day | ORAL | 0 refills | Status: DC

## 2018-07-24 NOTE — Telephone Encounter (Signed)
Copied from [place] [id number]. Topic: Quick Communication - Rx Refill/Question >> Jul 24, 2018 11:42 AM Percell Belt A wrote: Medication: pantoprazole (PROTONIX) 40 MG tablet [[id number]]  Has the patient contacted their pharmacy? No. (Agent: If no, request that the patient contact the pharmacy for the refill.) (Agent: If yes, when and what did the pharmacy advise?)  Preferred Pharmacy (with phone number or street name): [facility] [address], [place] [phone] (Phone)   Agent: Please be advised that RX refills may take up to 3 business days. We ask that you follow-up with your pharmacy.

## 2018-10-14 ENCOUNTER — Encounter: Payer: Self-pay | Admitting: Family

## 2018-10-14 ENCOUNTER — Ambulatory Visit (INDEPENDENT_AMBULATORY_CARE_PROVIDER_SITE_OTHER): Payer: Medicare Other | Admitting: Family

## 2018-10-14 ENCOUNTER — Other Ambulatory Visit: Payer: Self-pay

## 2018-10-14 VITALS — BP 128/78 | HR 88 | Temp 98.1°F | Ht 70.5 in | Wt 195.0 lb

## 2018-10-14 DIAGNOSIS — K219 Gastro-esophageal reflux disease without esophagitis: Secondary | ICD-10-CM | POA: Diagnosis not present

## 2018-10-14 DIAGNOSIS — D1722 Benign lipomatous neoplasm of skin and subcutaneous tissue of left arm: Secondary | ICD-10-CM

## 2018-10-14 MED ORDER — PANTOPRAZOLE SODIUM 40 MG PO TBEC: 40.0000 mg | DELAYED_RELEASE_TABLET | Freq: Every day | ORAL | 1 refills | Status: DC

## 2018-10-14 NOTE — Progress Notes (Signed)
Shane Fischer is a 78 y.o. male with the following history as recorded in EpicCare:  Patient Active Problem List   Diagnosis Date Noted  . Chest pain 04/03/2017  . Chest tightness or pressure 03/27/2017  . Rash 08/10/2016  . Constipation 06/17/2016  . GERD (gastroesophageal reflux disease) 06/17/2016  . Tick bite 09/18/2015  . Prostatitis 07/10/2015  . Vertigo 05/05/2015  . Hematuria 11/05/2012  . Melanoma ([place]) 09/12/2011  . Chronic sinusitis 06/23/2010  . Erectile dysfunction 06/23/2010  . Preventative health care 06/18/2010  . HYPERLIPIDEMIA 06/07/2007  . ALLERGIC RHINITIS 06/07/2007  . BENIGN PROSTATIC HYPERTROPHY 06/07/2007    Current Outpatient Medications  Medication Sig Dispense Refill  . Cetirizine HCl 10 MG TBDP Take 1 tablet by mouth daily.    . Naproxen Sodium (ALEVE) 220 MG CAPS Aleve  prn    . pantoprazole (PROTONIX) 40 MG tablet Take 1 tablet (40 mg total) by mouth daily. 90 tablet 1   No current facility-administered medications for this visit.     Allergies: Patient has no known allergies.  Past Medical History:  Diagnosis Date  . ALLERGIC RHINITIS 06/07/2007  . Allergy   . Arthritis    right ankle - Sees Al[facility]io  . BENIGN PROSTATIC HYPERTROPHY 06/07/2007  . Cataract   . CELLULITIS/ABSCESS, TRUNK 07/27/2009  . Chronic sinusitis 06/23/2010  . Erectile dysfunction 06/23/2010  . GERD (gastroesophageal reflux disease) 06/17/2016  . Hearing loss    Bilateral does not wear hearing aids  . HYPERLIPIDEMIA 06/07/2007   Diet controlled- triglycerides are great   . Melanoma ([place]) 09/12/2011   Right arm, 2006 - Dr Kelle Darting -  pre melanoma   . Neuromuscular disorder ([facility])    neuropathy boot feet on toes   . OTITIS MEDIA, ACUTE, RIGHT 02/04/2009    Past Surgical History:  Procedure Laterality Date  . cataract Bilateral   . COLONOSCOPY    . inguinal herniorrhapy     right and left  . MELANOMA EXCISION  2005   right arm  . POLYPECTOMY    . s/p left shoulder  lipoma    . UPPER GASTROINTESTINAL ENDOSCOPY  02/02/2017   Ardis Hughs     Family History  Problem Relation Age of Onset  . Cancer Mother        colon  . Colon cancer Mother 48  . Sudden death Father 37  . Heart attack Other   . Stomach cancer Neg Hx   . Esophageal cancer Neg Hx   . Pancreatic cancer Neg Hx   . Colon polyps Neg Hx   . Rectal cancer Neg Hx     Social History   Tobacco Use  . Smoking status: Former Smoker    Types: Cigars, Pipe  . Smokeless tobacco: Never Used  Substance Use Topics  . Alcohol use: Yes    Alcohol/week: 5.0 standard drinks    Types: 5 Glasses of wine per week    Comment: occ    Subjective:  Patient presents with concerns for recurrent lipoma in left arm; symptoms present x 2-70months; notes that area has been painful for the past month; history of lipoma removed approximately 10 years ago in the same area; was told that lesion would probably recur; requesting surgical consult;  Also requesting refill for Protonix; due for CPE follow-up later this year;     Objective:  Vitals:   10/14/18 1506  BP: 128/78  Pulse: 88  Temp: 98.1 F (36.7 C)  TempSrc: Oral  SpO2: 97%  Weight: 195 lb (88.5 kg)  Height: 5' 10.5" (1.791 m)    General: Well developed, well nourished, in no acute distress  Skin : Warm and dry. Single, movable lesion noted in upper left arm;  Head: Normocephalic and atraumatic  Eyes: Sclera and conjunctiva clear; pupils round and reactive to light; extraocular movements intact  Ears: External normal; canals clear; tympanic membranes normal  Oropharynx: Pink, supple. No suspicious lesions  Neck: Supple without thyromegaly, adenopathy  Lungs: Respirations unlabored;  Neurologic: Alert and oriented; speech intact; face symmetrical; moves all extremities well; CNII-XII intact without focal deficit   Assessment:  1. Lipoma of left upper extremity   2. Gastroesophageal reflux disease, esophagitis presence not specified     Plan:   Referral to general surgeon;  Refill on Protonix; needs to see PCP and have AWV updated.   Return in about 4 months (around 02/13/2019) for AWV with Sharee Pimple.  Orders Placed This Encounter  Procedures  . Ambulatory referral to General Surgery    Referral Priority:   Routine    Referral Type:   Surgical    Referral Reason:   Specialty Services Required    Requested Specialty:   General Surgery    Number of Visits Requested:   1    Requested Prescriptions   Signed Prescriptions Disp Refills  . pantoprazole (PROTONIX) 40 MG tablet 90 tablet 1    Sig: Take 1 tablet (40 mg total) by mouth daily.

## 2018-10-29 ENCOUNTER — Telehealth: Payer: Self-pay | Admitting: Family

## 2018-10-29 NOTE — Telephone Encounter (Signed)
Pt states that a referral was made a to [facility] a couple week ago and he has not heard anything back. Could they pls reach out if possible. [phone]

## 2018-10-30 NOTE — Telephone Encounter (Signed)
[facility] and left vm for their new pt coordinator to contact pt. I called pt and he is aware

## 2018-11-07 ENCOUNTER — Other Ambulatory Visit (INDEPENDENT_AMBULATORY_CARE_PROVIDER_SITE_OTHER): Payer: Medicare Other

## 2018-11-07 ENCOUNTER — Ambulatory Visit (INDEPENDENT_AMBULATORY_CARE_PROVIDER_SITE_OTHER): Payer: Medicare Other | Admitting: Internal Medicine

## 2018-11-07 ENCOUNTER — Encounter: Payer: Self-pay | Admitting: Internal Medicine

## 2018-11-07 ENCOUNTER — Other Ambulatory Visit: Payer: Self-pay

## 2018-11-07 VITALS — BP 116/80 | HR 70 | Temp 98.2°F | Ht 70.5 in | Wt 194.0 lb

## 2018-11-07 DIAGNOSIS — E559 Vitamin D deficiency, unspecified: Secondary | ICD-10-CM

## 2018-11-07 DIAGNOSIS — K219 Gastro-esophageal reflux disease without esophagitis: Secondary | ICD-10-CM | POA: Diagnosis not present

## 2018-11-07 DIAGNOSIS — E611 Iron deficiency: Secondary | ICD-10-CM

## 2018-11-07 DIAGNOSIS — Z23 Encounter for immunization: Secondary | ICD-10-CM | POA: Diagnosis not present

## 2018-11-07 DIAGNOSIS — H61102 Unspecified noninfective disorders of pinna, left ear: Secondary | ICD-10-CM

## 2018-11-07 DIAGNOSIS — M19071 Primary osteoarthritis, right ankle and foot: Secondary | ICD-10-CM | POA: Diagnosis not present

## 2018-11-07 DIAGNOSIS — Z0001 Encounter for general adult medical examination with abnormal findings: Secondary | ICD-10-CM

## 2018-11-07 DIAGNOSIS — E538 Deficiency of other specified B group vitamins: Secondary | ICD-10-CM

## 2018-11-07 LAB — TSH: TSH: 1.69 u[IU]/mL (ref 0.35–4.50)

## 2018-11-07 LAB — PSA: PSA: 0.55 ng/mL (ref 0.10–4.00)

## 2018-11-07 LAB — HEPATIC FUNCTION PANEL
ALT: 18 U/L (ref 0–53)
AST: 20 U/L (ref 0–37)
Albumin: 4.4 g/dL (ref 3.5–5.2)
Alkaline Phosphatase: 39 U/L (ref 39–117)
Bilirubin, Direct: 0.2 mg/dL (ref 0.0–0.3)
Total Bilirubin: 1.5 mg/dL — ABNORMAL HIGH (ref 0.2–1.2)
Total Protein: 6.9 g/dL (ref 6.0–8.3)

## 2018-11-07 LAB — CBC WITH DIFFERENTIAL/PLATELET
Basophils Absolute: 0.1 10*3/uL (ref 0.0–0.1)
Basophils Relative: 0.9 % (ref 0.0–3.0)
Eosinophils Absolute: 0.1 10*3/uL (ref 0.0–0.7)
Eosinophils Relative: 1.6 % (ref 0.0–5.0)
HCT: 46 % (ref 39.0–52.0)
Hemoglobin: 15.5 g/dL (ref 13.0–17.0)
Lymphocytes Relative: 27.5 % (ref 12.0–46.0)
Lymphs Abs: 1.5 10*3/uL (ref 0.7–4.0)
MCHC: 33.8 g/dL (ref 30.0–36.0)
MCV: 98.3 fl (ref 78.0–100.0)
Monocytes Absolute: 0.5 10*3/uL (ref 0.1–1.0)
Monocytes Relative: 9.5 % (ref 3.0–12.0)
Neutro Abs: 3.4 10*3/uL (ref 1.4–7.7)
Neutrophils Relative %: 60.5 % (ref 43.0–77.0)
Platelets: 266 10*3/uL (ref 150.0–400.0)
RBC: 4.68 Mil/uL (ref 4.22–5.81)
RDW: 14.8 % (ref 11.5–15.5)
WBC: 5.6 10*3/uL (ref 4.0–10.5)

## 2018-11-07 LAB — LIPID PANEL
Cholesterol: 204 mg/dL — ABNORMAL HIGH (ref 0–200)
HDL: 73.5 mg/dL (ref >39.00)
LDL Cholesterol: 121 mg/dL — ABNORMAL HIGH (ref 0–99)
NonHDL: 130.95
Total CHOL/HDL Ratio: 3
Triglycerides: 50 mg/dL (ref 0.0–149.0)
VLDL: 10 mg/dL (ref 0.0–40.0)

## 2018-11-07 LAB — BASIC METABOLIC PANEL
BUN: 15 mg/dL (ref 6–23)
CO2: 32 mEq/L (ref 19–32)
Calcium: 9.1 mg/dL (ref 8.4–10.5)
Chloride: 101 mEq/L (ref 96–112)
Creatinine, Ser: 0.82 mg/dL (ref 0.40–1.50)
GFR: 91.36 mL/min (ref >60.00)
Glucose, Bld: 93 mg/dL (ref 70–99)
Potassium: 4.2 mEq/L (ref 3.5–5.1)
Sodium: 139 mEq/L (ref 135–145)

## 2018-11-07 LAB — IBC PANEL
Iron: 114 ug/dL (ref 42–165)
Saturation Ratios: 30.4 % (ref 20.0–50.0)
Transferrin: 268 mg/dL (ref 212.0–360.0)

## 2018-11-07 LAB — VITAMIN B12: Vitamin B-12: 128 pg/mL — ABNORMAL LOW (ref 211–911)

## 2018-11-07 LAB — VITAMIN D 25 HYDROXY (VIT D DEFICIENCY, FRACTURES): VITD: 18.68 ng/mL — ABNORMAL LOW (ref 30.00–100.00)

## 2018-11-07 NOTE — Assessment & Plan Note (Addendum)
C/w likely skin cancer, for [facility] referral  In addition to the time spent performing CPE, I spent an additional 25 minutes face to face,in which greater than 50% of this time was spent in counseling and coordination of care for patient's acute illness as documented, including the differential dx, treatment, further evaluation and other management of left pinna skin lesion, right ankle djd, gerd

## 2018-11-07 NOTE — Assessment & Plan Note (Signed)
Chronic persistent worsening, ok for ortho referral, cont same tx

## 2018-11-07 NOTE — Patient Instructions (Signed)
Please continue all other medications as before, and refills have been done if requested.  Please have the pharmacy call with any other refills you may need.  Please continue your efforts at being more active, low cholesterol diet, and weight control.  You are otherwise up to date with prevention measures today.  Please keep your appointments with your specialists as you may have planned  You will be contacted regarding the referral for: [facility], and [facility] (ankle specialist)  Please go to the LAB in the Basement (turn left off the elevator) for the tests to be done today  You will be contacted by phone if any changes need to be made immediately.  Otherwise, you will receive a letter about your results with an explanation, but please check with MyChart first.  Please remember to sign up for MyChart if you have not done so, as this will be important to you in the future with finding out test results, communicating by private email, and scheduling acute appointments online when needed.  Please return in 1 year for your yearly visit, or sooner if needed, with Lab testing done 3-5 days before

## 2018-11-07 NOTE — Assessment & Plan Note (Signed)

## 2018-11-07 NOTE — Assessment & Plan Note (Signed)
stable overall by history and exam, recent data reviewed with pt, and pt to continue medical treatment as before,  to f/u any worsening symptoms or concerns  

## 2018-11-07 NOTE — Progress Notes (Signed)
Subjective:    Patient ID: Shane Fischer, male    DOB: 1940/07/21, 78 y.o.   MRN: [id number]  HPI  Here for wellness and f/u;  Overall doing ok;  Pt denies Chest pain, worsening SOB, DOE, wheezing, orthopnea, PND, worsening LE edema, palpitations, dizziness or syncope.  Pt denies neurological change such as new headache, facial or extremity weakness.  Pt denies polydipsia, polyuria, or low sugar symptoms. Pt states overall good compliance with treatment and medications, good tolerability, and has been trying to follow appropriate diet.  Pt denies worsening depressive symptoms, suicidal ideation or panic. No fever, night sweats, wt loss, loss of appetite, or other constitutional symptoms.  Pt states good ability with ADL's, has low fall risk, home safety reviewed and adequate, no other significant changes in hearing or vision, and only occasionally active with exercise. Due for lipoma surgury re-do soon. Has ongoing severe right ankle DJD, and recently right knee and hand DJD pain.  Also mentions spots of blood on the pillow case for several months recently without overt nosebleed or other; he did finally realized left post ear lesion he now thinks is related, nontender, but not healing.  Also has several years woresning right ankle pain, sweling now becoming unstable but no falls, moderate, constant, worse to walk, better to sit.  Denies worsening reflux, abd pain, dysphagia, n/v, bowel change or blood. Past Medical History:  Diagnosis Date  . ALLERGIC RHINITIS 06/07/2007  . Allergy   . Arthritis    right ankle - Sees Aluisio  . BENIGN PROSTATIC HYPERTROPHY 06/07/2007  . Cataract   . CELLULITIS/ABSCESS, TRUNK 07/27/2009  . Chronic sinusitis 06/23/2010  . Erectile dysfunction 06/23/2010  . GERD (gastroesophageal reflux disease) 06/17/2016  . Hearing loss    Bilateral does not wear hearing aids  . HYPERLIPIDEMIA 06/07/2007   Diet controlled- triglycerides are great   . Melanoma ([place]) 09/12/2011   Right  arm, 2006 - Dr Kelle Darting -  pre melanoma   . Neuromuscular disorder (HCC)    neuropathy boot feet on toes   . OTITIS MEDIA, ACUTE, RIGHT 02/04/2009   Past Surgical History:  Procedure Laterality Date  . cataract Bilateral   . COLONOSCOPY    . inguinal herniorrhapy     right and left  . MELANOMA EXCISION  2005   right arm  . POLYPECTOMY    . s/p left shoulder lipoma    . UPPER GASTROINTESTINAL ENDOSCOPY  02/02/2017   Ardis Hughs     reports that he has quit smoking. His smoking use included cigars and pipe. He has never used smokeless tobacco. He reports current alcohol use of about 5.0 standard drinks of alcohol per week. He reports that he does not use drugs. family history includes Cancer in his mother; Colon cancer (age of onset: 53) in his mother; Heart attack in an other family member; Sudden death (age of onset: 35) in his father. No Known Allergies Current Outpatient Medications on File Prior to Visit  Medication Sig Dispense Refill  . Cetirizine HCl 10 MG TBDP Take 1 tablet by mouth daily.    . Naproxen Sodium (ALEVE) 220 MG CAPS Aleve  prn    . pantoprazole (PROTONIX) 40 MG tablet Take 1 tablet (40 mg total) by mouth daily. 90 tablet 1   No current facility-administered medications on file prior to visit.    Review of Systems Constitutional: Negative for other unusual diaphoresis, sweats, appetite or weight changes HENT: Negative for other worsening hearing loss, ear  pain, facial swelling, mouth sores or neck stiffness.   Eyes: Negative for other worsening pain, redness or other visual disturbance.  Respiratory: Negative for other stridor or swelling Cardiovascular: Negative for other palpitations or other chest pain  Gastrointestinal: Negative for worsening diarrhea or loose stools, blood in stool, distention or other pain Genitourinary: Negative for hematuria, flank pain or other change in urine volume.  Musculoskeletal: Negative for myalgias or other joint swelling.   Skin: Negative for other color change, or other wound or worsening drainage.  Neurological: Negative for other syncope or numbness. Hematological: Negative for other adenopathy or swelling Psychiatric/Behavioral: Negative for hallucinations, other worsening agitation, SI, self-injury, or new decreased concentration All other system neg per pt    Objective:   Physical Exam BP 116/80   Pulse 70   Temp 98.2 F (36.8 C) (Oral)   Ht 5' 10.5" (1.791 m)   Wt 194 lb (88 kg)   SpO2 97%   BMI 27.44 kg/m  VS noted,  Constitutional: Pt is oriented to person, place, and time. Appears well-developed and well-nourished, in no significant distress and comfortable Head: Normocephalic and atraumatic  Eyes: Conjunctivae and EOM are normal. Pupils are equal, round, and reactive to light Right Ear: External ear normal without discharge Left Ear: External ear normal without discharge Nose: Nose without discharge or deformity Mouth/Throat: Oropharynx is without other ulcerations and moist  Neck: Normal range of motion. Neck supple. No JVD present. No tracheal deviation present or significant neck LA or mass Cardiovascular: Normal rate, regular rhythm, normal heart sounds and intact distal pulses.   Pulmonary/Chest: WOB normal and breath sounds without rales or wheezing  Abdominal: Soft. Bowel sounds are normal. NT. No HSM  Musculoskeletal: Normal range of motion. Exhibits no edema except for right nkle bony degenerative changes Lymphadenopathy: Has no other cervical adenopathy.  Neurological: Pt is alert and oriented to person, place, and time. Pt has normal reflexes. No cranial nerve deficit. Motor grossly intact, Gait intact Skin: Skin is warm and dry. No rash noted but has post left upper pinna shallow ulcerated lesion nontender Psychiatric:  Has normal mood and affect. Behavior is normal without agitation No other exam findings Lab Results  Component Value Date   WBC 5.6 11/07/2018   HGB 15.5  11/07/2018   HCT 46.0 11/07/2018   PLT 266.0 11/07/2018   GLUCOSE 93 11/07/2018   CHOL 204 (H) 11/07/2018   TRIG 50.0 11/07/2018   HDL 73.50 11/07/2018   LDLDIRECT 132.6 09/12/2011   LDLCALC 121 (H) 11/07/2018   ALT 18 11/07/2018   AST 20 11/07/2018   NA 139 11/07/2018   K 4.2 11/07/2018   CL 101 11/07/2018   CREATININE 0.82 11/07/2018   BUN 15 11/07/2018   CO2 32 11/07/2018   TSH 1.69 11/07/2018   PSA 0.55 11/07/2018       Assessment & Plan:

## 2018-11-08 ENCOUNTER — Other Ambulatory Visit: Payer: Self-pay | Admitting: Internal Medicine

## 2018-11-08 ENCOUNTER — Telehealth: Payer: Self-pay

## 2018-11-08 ENCOUNTER — Encounter: Payer: Self-pay | Admitting: Internal Medicine

## 2018-11-08 ENCOUNTER — Other Ambulatory Visit (INDEPENDENT_AMBULATORY_CARE_PROVIDER_SITE_OTHER): Payer: Medicare Other

## 2018-11-08 DIAGNOSIS — Z0001 Encounter for general adult medical examination with abnormal findings: Secondary | ICD-10-CM | POA: Diagnosis not present

## 2018-11-08 LAB — URINALYSIS, ROUTINE W REFLEX MICROSCOPIC
Bilirubin Urine: NEGATIVE
Hgb urine dipstick: NEGATIVE
Ketones, ur: NEGATIVE
Leukocytes,Ua: NEGATIVE
Nitrite: NEGATIVE
RBC / HPF: NONE SEEN (ref 0–?)
Specific Gravity, Urine: 1.025 (ref 1.000–1.030)
Total Protein, Urine: NEGATIVE
Urine Glucose: NEGATIVE
Urobilinogen, UA: 1 (ref 0.0–1.0)
WBC, UA: NONE SEEN (ref 0–?)
pH: 6 (ref 5.0–8.0)

## 2018-11-08 MED ORDER — ATORVASTATIN CALCIUM 10 MG PO TABS: 10.0000 mg | ORAL_TABLET | Freq: Every day | ORAL | 3 refills | Status: DC

## 2018-11-08 MED ORDER — VITAMIN D (ERGOCALCIFEROL) 1.25 MG (50000 UNIT) PO CAPS: 50000.0000 [IU] | ORAL_CAPSULE | ORAL | 0 refills | Status: DC

## 2018-11-08 NOTE — Telephone Encounter (Signed)
Called pt, LVM.   CRM created.  

## 2018-11-08 NOTE — Telephone Encounter (Signed)
-----   Message from Biagio Borg, MD sent at 11/08/2018  9:28 AM EDT ----- Left message on MyChart, pt to cont same tx except  The test results show that your current treatment is OK, except for low Vitamin D level, low Vitamin B12 level, and persistent mild elevated LDL cholesterol.    We need to: 1)  Please take Vitamin D 50000 units weekly for 12 weeks, then plan to change to OTC Vitamin D3 at 2000 units per day, indefinitely. 2)  Start monthly B12 shots here in the office until next seen 3)  Please start low dose lipitor 10 mg per day, and follow lower cholesterol diet    Shane Fischer to please inform pt, I will do rx x 2, and help pt with start B12 shots

## 2018-11-12 ENCOUNTER — Telehealth: Payer: Self-pay

## 2018-11-12 DIAGNOSIS — H903 Sensorineural hearing loss, bilateral: Secondary | ICD-10-CM | POA: Diagnosis not present

## 2018-11-12 NOTE — Telephone Encounter (Signed)
Pt has viewed results via MyChart  

## 2018-11-12 NOTE — Telephone Encounter (Signed)
-----   Message from Biagio Borg, MD sent at 11/08/2018  4:54 PM EDT ----- stable overall by history and exam, recent data reviewed with Shane Fischer, and Shane Fischer to continue medical treatment as before,  to f/u any worsening symptoms or concerns

## 2018-11-13 DIAGNOSIS — D1779 Benign lipomatous neoplasm of other sites: Secondary | ICD-10-CM | POA: Diagnosis not present

## 2018-12-05 HISTORY — PX: LIPOMA EXCISION: SHX5283

## 2018-12-13 DIAGNOSIS — Z1159 Encounter for screening for other viral diseases: Secondary | ICD-10-CM | POA: Diagnosis not present

## 2018-12-17 ENCOUNTER — Other Ambulatory Visit: Payer: Self-pay | Admitting: General Surgery

## 2018-12-17 DIAGNOSIS — D1722 Benign lipomatous neoplasm of skin and subcutaneous tissue of left arm: Secondary | ICD-10-CM | POA: Diagnosis not present

## 2018-12-17 DIAGNOSIS — D179 Benign lipomatous neoplasm, unspecified: Secondary | ICD-10-CM | POA: Diagnosis not present

## 2018-12-25 DIAGNOSIS — C44219 Basal cell carcinoma of skin of left ear and external auricular canal: Secondary | ICD-10-CM | POA: Diagnosis not present

## 2018-12-25 DIAGNOSIS — D485 Neoplasm of uncertain behavior of skin: Secondary | ICD-10-CM | POA: Diagnosis not present

## 2018-12-25 DIAGNOSIS — L578 Other skin changes due to chronic exposure to nonionizing radiation: Secondary | ICD-10-CM | POA: Diagnosis not present

## 2019-01-01 DIAGNOSIS — M25571 Pain in right ankle and joints of right foot: Secondary | ICD-10-CM | POA: Diagnosis not present

## 2019-01-01 DIAGNOSIS — M19071 Primary osteoarthritis, right ankle and foot: Secondary | ICD-10-CM | POA: Diagnosis not present

## 2019-01-09 DIAGNOSIS — M19071 Primary osteoarthritis, right ankle and foot: Secondary | ICD-10-CM | POA: Diagnosis not present

## 2019-02-07 NOTE — Progress Notes (Addendum)
Subjective:   Shane Fischer is a 78 y.o. male who presents for Medicare Annual/Subsequent preventive examination.  This visit occurred during the SARS-CoV-2 public health emergency.  Safety protocols were in place, including screening questions prior to the visit, additional usage of staff PPE, and extensive cleaning of exam room while observing appropriate contact time as indicated for disinfecting solutions.   Review of Systems:   Cardiac Risk Factors include: advanced age (>23men, >74 women) Sleep patterns: gets up 0-2 times nightly to void and sleeps 6-7 hours nightly.    Home Safety/Smoke Alarms: Feels safe in home. Smoke alarms in place.  Living environment; residence and Firearm Safety: 1-story house/ trailer. Lives alone, no needs for DME, good support system Seat Belt Safety/Bike Helmet: Wears seat belt.     Objective:    Vitals: BP (!) 142/78   Pulse 64   Resp 17   Ht 5\' 11"  (1.803 m)   Wt 198 lb (89.8 kg)   SpO2 99%   BMI 27.62 kg/m   Body mass index is 27.62 kg/m.  Advanced Directives 02/11/2019 01/24/2018 09/14/2016 02/23/2015 02/09/2015  Does Patient Have a Medical Advance Directive? Yes Yes Yes Yes Yes  Type of Paramedic of [place];Living will [place];Living will [place];Living will - [place];Living will  Does patient want to make changes to medical advance directive? - - - - No - Patient declined  Copy of [place] in Chart? No - copy requested No - copy requested No - copy requested No - copy requested No - copy requested    Tobacco Social History   Tobacco Use  Smoking Status Former Smoker  . Types: Cigars, Pipe  Smokeless Tobacco Never Used     Counseling given: Not Answered  Past Medical History:  Diagnosis Date  . ALLERGIC RHINITIS 06/07/2007  . Allergy   . Arthritis    right ankle - Sees Aluisio  . BENIGN PROSTATIC HYPERTROPHY 06/07/2007  .  Cataract   . CELLULITIS/ABSCESS, TRUNK 07/27/2009  . Chronic sinusitis 06/23/2010  . Erectile dysfunction 06/23/2010  . GERD (gastroesophageal reflux disease) 06/17/2016  . Hearing loss    Bilateral does not wear hearing aids  . HYPERLIPIDEMIA 06/07/2007   Diet controlled- triglycerides are great   . Melanoma ([place]) 09/12/2011   Right arm, 2006 - Dr Kelle Darting -  pre melanoma   . Neuromuscular disorder (HCC)    neuropathy boot feet on toes   . OTITIS MEDIA, ACUTE, RIGHT 02/04/2009   Past Surgical History:  Procedure Laterality Date  . cataract Bilateral   . COLONOSCOPY    . inguinal herniorrhapy     right and left  . MELANOMA EXCISION  2005   right arm  . POLYPECTOMY    . s/p left shoulder lipoma    . UPPER GASTROINTESTINAL ENDOSCOPY  02/02/2017   Ardis Hughs    Family History  Problem Relation Age of Onset  . Cancer Mother        colon  . Colon cancer Mother 19  . Sudden death Father 52  . Heart attack Other   . Stomach cancer Neg Hx   . Esophageal cancer Neg Hx   . Pancreatic cancer Neg Hx   . Colon polyps Neg Hx   . Rectal cancer Neg Hx    Social History   Socioeconomic History  . Marital status: Widowed    Spouse name: Not on file  . Number of  children: 3  . Years of education: Not on file  . Highest education level: Not on file  Occupational History  . Occupation: semi-retired now Theme park manager  Social Needs  . Financial resource strain: Not hard at all  . Food insecurity    Worry: Never true    Inability: Never true  . Transportation needs    Medical: No    Non-medical: No  Tobacco Use  . Smoking status: Former Smoker    Types: Cigars, Pipe  . Smokeless tobacco: Never Used  Substance and Sexual Activity  . Alcohol use: Yes    Alcohol/week: 5.0 standard drinks    Types: 5 Glasses of  per week    Comment: occ  . Drug use: No  . Sexual activity: Not Currently  Lifestyle  . Physical activity    Days per week: 3 days    Minutes per session: 30 min  . Stress:  Not at all  Relationships  . Social connections    Talks on phone: More than three times a week    Gets together: More than three times a week    Attends religious service: More than 4 times per year    Active member of club or organization: Yes    Attends meetings of clubs or organizations: More than 4 times per year    Relationship status: Widowed  Other Topics Concern  . Not on file  Social History Narrative  . Not on file    Outpatient Encounter Medications as of 02/11/2019  Medication Sig  . Cetirizine HCl 10 MG TBDP Take 1 tablet by mouth daily.  . Cyanocobalamin ([employer] VITAMIN B12 PO) Take 1 tablet by mouth daily.  . Naproxen Sodium (ALEVE) 220 MG CAPS Aleve  prn  . NON FORMULARY 500 mg daily. niacinamide  . pantoprazole (PROTONIX) 40 MG tablet Take 1 tablet (40 mg total) by mouth daily.  Marland Kitchen VITAMIN D PO Take 1 tablet by mouth daily.  . Vitamin D, Ergocalciferol, (DRISDOL) 1.25 MG (50000 UT) CAPS capsule Take 1 capsule (50,000 Units total) by mouth every 7 (seven) days. (Patient not taking: Reported on 02/11/2019)  . [DISCONTINUED] atorvastatin (LIPITOR) 10 MG tablet Take 1 tablet (10 mg total) by mouth daily.   No facility-administered encounter medications on file as of 02/11/2019.     Activities of Daily Living In your present state of health, do you have any difficulty performing the following activities: 02/11/2019  Hearing? N  Vision? N  Difficulty concentrating or making decisions? N  Walking or climbing stairs? N  Dressing or bathing? N  Doing errands, shopping? N  Preparing Food and eating ? N  Using the Toilet? N  In the past six months, have you accidently leaked urine? N  Do you have problems with loss of bowel control? N  Managing your Medications? N  Managing your Finances? N  Housekeeping or managing your Housekeeping? N  Some recent data might be hidden    Patient Care Team: Biagio Borg, MD as PCP - General Bernardo Heater, Ronda Fairly, MD (Urology) Milus Banister, MD as Attending Physician (Gastroenterology) Wylene Simmer, MD as Consulting Physician (Orthopedic Surgery) Ralene Ok, MD as Consulting Physician (General Surgery)   Assessment:   This is a routine wellness examination for Shane Fischer. Physical assessment deferred to PCP.  Exercise Activities and Dietary recommendations Current Exercise Habits: Home exercise routine, Type of exercise: treadmill, Time (Minutes): 25, Frequency (Times/Week): 3, Weekly Exercise (Minutes/Week): 75, Intensity: Mild, Exercise limited by: orthopedic  condition(s)  Diet (meal preparation, eat out, water intake, caffeinated beverages, dairy products, fruits and vegetables): in general, a "healthy" diet  , well balanced eats a variety of fruits and vegetables daily, limits salt, fat/cholesterol, sugar,carbohydrates,caffeine, drinks 6-8 glasses of water daily.   Goals    . Maintain current health status     Continue to eat healthy and exercise, worship God, enjoy life, and family.        Fall Risk Fall Risk  02/11/2019 11/07/2018 01/24/2018 10/10/2017 09/14/2016  Falls in the past year? 0 0 1 No No  Number falls in past yr: 0 0 0 - -  Injury with Fall? 0 0 0 - -  Risk for fall due to : Impaired balance/gait - Impaired mobility - -  Risk for fall due to: Comment - - chronic knee and ankle pain - -  Follow up Falls prevention discussed - - - -   Is the patient's home free of loose throw rugs in walkways, pet beds, electrical cords, etc?   yes      Grab bars in the bathroom? yes      Handrails on the stairs?   yes      Adequate lighting?   yes  Depression Screen PHQ 2/9 Scores 02/11/2019 11/07/2018 01/24/2018 09/14/2016  PHQ - 2 Score 0 0 1 0  PHQ- 9 Score - - 1 -    Cognitive Function MMSE - Mini Mental State Exam 01/24/2018  Not completed: Refused       Ad8 score reviewed for issues:  Issues making decisions: no  Less interest in hobbies / activities: no  Repeats questions, stories (family  complaining): no  Trouble using ordinary gadgets (microwave, computer, phone):no  Forgets the month or year: no  Mismanaging finances: no  Remembering appts: no  Daily problems with thinking and/or memory: no Ad8 score is= 0  Immunization History  Administered Date(s) Administered  . Fluad Quad(high Dose 65+) 11/07/2018  . H1N1 05/11/2008  . Influenza Whole 12/05/2007  . Influenza, High Dose Seasonal PF 03/28/2016, 12/29/2016, 01/24/2018  . Influenza, Seasonal, Injecte, Preservative Fre 02/07/2012  . Influenza,inj,Quad PF,6+ Mos 11/05/2012, 11/18/2013, 11/20/2014  . Pneumococcal Conjugate-13 12/25/2014  . Pneumococcal Polysaccharide-23 05/11/2008  . Td 05/11/2008  . Tdap 11/18/2013  . Zoster 11/18/2013   Screening Tests Health Maintenance  Topic Date Due  . COLONOSCOPY  03/28/2023  . TETANUS/TDAP  11/19/2023  . INFLUENZA VACCINE  Completed  . PNA vac Low Risk Adult  Completed       Plan:      Reviewed health maintenance screenings with patient today and relevant education, vaccines, and/or referrals were provided.   I have personally reviewed and noted the following in the patient's chart:   . Medical and social history . Use of alcohol, tobacco or illicit drugs  . Current medications and supplements . Functional ability and status . Nutritional status . Physical activity . Advanced directives . List of other physicians . Screenings to include cognitive, depression, and falls . Referrals and appointments  In addition, I have reviewed and discussed with patient certain preventive protocols, quality metrics, and best practice recommendations. A written personalized care plan for preventive services as well as general preventive health recommendations were provided to patient.     Michiel Cowboy, RN  02/11/2019  Medical screening examination/treatment/procedure(s) were performed by non-physician practitioner and as supervising physician I was immediately available  for consultation/collaboration. I agree with above. Cathlean Cower, MD

## 2019-02-10 ENCOUNTER — Telehealth: Payer: Self-pay | Admitting: Internal Medicine

## 2019-02-10 NOTE — Telephone Encounter (Signed)
Copied from [place] [phone]. Topic: General - Other >> Feb 10, 2019  2:02 PM Oneta Rack wrote: Reason for [id number] requesting to speak with Sharee Pimple, please advise patient directly

## 2019-02-11 ENCOUNTER — Ambulatory Visit (INDEPENDENT_AMBULATORY_CARE_PROVIDER_SITE_OTHER): Payer: Medicare Other | Admitting: *Deleted

## 2019-02-11 ENCOUNTER — Other Ambulatory Visit: Payer: Self-pay

## 2019-02-11 VITALS — BP 142/78 | HR 64 | Resp 17 | Ht 71.0 in | Wt 198.0 lb

## 2019-02-11 DIAGNOSIS — Z0000 Encounter for general adult medical examination without abnormal findings: Secondary | ICD-10-CM

## 2019-02-11 NOTE — Patient Instructions (Addendum)
Continue doing brain stimulating activities (puzzles, reading, adult coloring books, staying active) to keep memory sharp.   Continue to eat heart healthy diet (full of fruits, vegetables, whole grains, lean protein, water--limit salt, fat, and sugar intake) and increase physical activity as tolerated.   [name] , Thank you for taking time to come for your Medicare Wellness Visit. I appreciate your ongoing commitment to your health goals. Please review the following plan we discussed and let me know if I can assist you in the future.   These are the goals we discussed: Goals    . Maintain current health status     Continue to eat healthy and exercise, worship God, enjoy life, and family.        This is a list of the screening recommended for you and due dates:  Health Maintenance  Topic Date Due  . Colon Cancer Screening  03/28/2023  . Tetanus Vaccine  11/19/2023  . Flu Shot  Completed  . Pneumonia vaccines  Completed     Preventive Care 13 Years and Older, Male Preventive care refers to lifestyle choices and visits with your health care provider that can promote health and wellness. This includes:  A yearly physical exam. This is also called an annual well check.  Regular dental and eye exams.  Immunizations.  Screening for certain conditions.  Healthy lifestyle choices, such as diet and exercise. What can I expect for my preventive care visit? Physical exam Your health care provider will check:  Height and weight. These may be used to calculate body mass index (BMI), which is a measurement that tells if you are at a healthy weight.  Heart rate and blood pressure.  Your skin for abnormal spots. Counseling Your health care provider may ask you questions about:  Alcohol, tobacco, and drug use.  Emotional well-being.  Home and relationship well-being.  Sexual activity.  Eating habits.  History of falls.  Memory and ability to understand (cognition).   Work and work Statistician. What immunizations do I need?  Influenza (flu) vaccine  This is recommended every year. Tetanus, diphtheria, and pertussis (Tdap) vaccine  You may need a Td booster every 10 years. Varicella (chickenpox) vaccine  You may need this vaccine if you have not already been vaccinated. Zoster (shingles) vaccine  You may need this after age 43. Pneumococcal conjugate (PCV13) vaccine  One dose is recommended after age 106. Pneumococcal polysaccharide (PPSV23) vaccine  One dose is recommended after age 57. Measles, mumps, and rubella (MMR) vaccine  You may need at least one dose of MMR if you were born in 1957 or later. You may also need a second dose. Meningococcal conjugate (MenACWY) vaccine  You may need this if you have certain conditions. Hepatitis A vaccine  You may need this if you have certain conditions or if you travel or work in places where you may be exposed to hepatitis A. Hepatitis B vaccine  You may need this if you have certain conditions or if you travel or work in places where you may be exposed to hepatitis B. Haemophilus influenzae type b (Hib) vaccine  You may need this if you have certain conditions. You may receive vaccines as individual doses or as more than one vaccine together in one shot (combination vaccines). Talk with your health care provider about the risks and benefits of combination vaccines. What tests do I need? Blood tests  Lipid and cholesterol levels. These may be checked every 5 years, or more frequently  depending on your overall health.  Hepatitis C test.  Hepatitis B test. Screening  Lung cancer screening. You may have this screening every year starting at age 80 if you have a 30-pack-year history of smoking and currently smoke or have quit within the past 15 years.  Colorectal cancer screening. All adults should have this screening starting at age 22 and continuing until age 50. Your health care provider may  recommend screening at age 51 if you are at increased risk. You will have tests every 1-10 years, depending on your results and the type of screening test.  Prostate cancer screening. Recommendations will vary depending on your family history and other risks.  Diabetes screening. This is done by checking your blood sugar (glucose) after you have not eaten for a while (fasting). You may have this done every 1-3 years.  Abdominal aortic aneurysm (AAA) screening. You may need this if you are a current or former smoker.  Sexually transmitted disease (STD) testing. Follow these instructions at home: Eating and drinking  Eat a diet that includes fresh fruits and vegetables, whole grains, lean protein, and low-fat dairy products. Limit your intake of foods with high amounts of sugar, saturated fats, and salt.  Take vitamin and mineral supplements as recommended by your health care provider.  Do not drink alcohol if your health care provider tells you not to drink.  If you drink alcohol: ? Limit how much you have to 0-2 drinks a day. ? Be aware of how much alcohol is in your drink. In the [place] one drink equals one 12 oz bottle of beer (355 mL), one 5 oz glass of Shane Fischer (148 mL), or one 1 oz glass of hard liquor (44 mL). Lifestyle  Take daily care of your teeth and gums.  Stay active. Exercise for at least 30 minutes on 5 or more days each week.  Do not use any products that contain nicotine or tobacco, such as cigarettes, e-cigarettes, and chewing tobacco. If you need help quitting, ask your health care provider.  If you are sexually active, practice safe sex. Use a condom or other form of protection to prevent STIs (sexually transmitted infections).  Talk with your health care provider about taking a low-dose aspirin or statin. What's next?  Visit your health care provider once a year for a well check visit.  Ask your health care provider how often you should have your eyes and teeth  checked.  Stay up to date on all vaccines. This information is not intended to replace advice given to you by your health care provider. Make sure you discuss any questions you have with your health care provider. Document Released: 03/19/2015 Document Revised: 02/14/2018 Document Reviewed: 02/14/2018 Elsevier Patient Education  2020 [employer].

## 2019-02-18 DIAGNOSIS — H43813 Vitreous degeneration, bilateral: Secondary | ICD-10-CM | POA: Diagnosis not present

## 2019-02-18 DIAGNOSIS — H26493 Other secondary cataract, bilateral: Secondary | ICD-10-CM | POA: Diagnosis not present

## 2019-02-18 DIAGNOSIS — H35371 Puckering of macula, right eye: Secondary | ICD-10-CM | POA: Diagnosis not present

## 2019-02-18 DIAGNOSIS — Z961 Presence of intraocular lens: Secondary | ICD-10-CM | POA: Diagnosis not present

## 2019-02-19 DIAGNOSIS — C44219 Basal cell carcinoma of skin of left ear and external auricular canal: Secondary | ICD-10-CM | POA: Diagnosis not present

## 2019-02-21 ENCOUNTER — Ambulatory Visit: Payer: Self-pay | Admitting: Internal Medicine

## 2019-02-24 ENCOUNTER — Other Ambulatory Visit: Payer: Self-pay

## 2019-02-24 ENCOUNTER — Encounter: Payer: Self-pay | Admitting: Internal Medicine

## 2019-02-24 ENCOUNTER — Ambulatory Visit (INDEPENDENT_AMBULATORY_CARE_PROVIDER_SITE_OTHER): Payer: Medicare Other | Admitting: Internal Medicine

## 2019-02-24 VITALS — BP 144/82 | HR 75 | Temp 98.7°F | Ht 71.0 in | Wt 199.0 lb

## 2019-02-24 DIAGNOSIS — R030 Elevated blood-pressure reading, without diagnosis of hypertension: Secondary | ICD-10-CM

## 2019-02-24 DIAGNOSIS — M25512 Pain in left shoulder: Secondary | ICD-10-CM

## 2019-02-24 DIAGNOSIS — E785 Hyperlipidemia, unspecified: Secondary | ICD-10-CM

## 2019-02-24 DIAGNOSIS — E559 Vitamin D deficiency, unspecified: Secondary | ICD-10-CM | POA: Diagnosis not present

## 2019-02-24 DIAGNOSIS — E538 Deficiency of other specified B group vitamins: Secondary | ICD-10-CM | POA: Diagnosis not present

## 2019-02-24 DIAGNOSIS — Z0000 Encounter for general adult medical examination without abnormal findings: Secondary | ICD-10-CM

## 2019-02-24 NOTE — Progress Notes (Signed)
Subjective:    Patient ID: Shane Fischer, male    DOB: March 18, 1940, 78 y.o.   MRN: [id number]  HPI  Here to f/u; overall doing ok,  Pt denies chest pain, increasing sob or doe, wheezing, orthopnea, PND, increased LE swelling, palpitations, dizziness or syncope.  Pt denies new neurological symptoms such as new headache, or facial or extremity weakness or numbness.  Pt denies polydipsia, polyuria, or low sugar episode.  Pt states overall good compliance with meds, mostly trying to follow appropriate diet, with wt overall stable,  but little exercise however.  S/p basal cell ca removed last wk per derm Dr Fredia Sorrow, also the left upper arm lipoma removed.  Left arm pain persists however mostly left shoulder, mild to mod, sharp, intermitent for  mo, worse to forward elevated and abduct.    BP Readings from Last 3 Encounters:  02/24/19 (!) 144/82  02/11/19 (!) 142/78  11/07/18 116/80   Wt Readings from Last 3 Encounters:  02/24/19 199 lb (90.3 kg)  02/11/19 198 lb (89.8 kg)  11/07/18 194 lb (88 kg)  Pt has taken high dose oral Vit D x 12 wks, and wants to continue this and b12 oral otc.  Also is s/p cortisone per Dr Doran Durand Manson Passey for right talanavicular DJD now improved.  No other new complaints Past Medical History:  Diagnosis Date  . ALLERGIC RHINITIS 06/07/2007  . Allergy   . Arthritis    right ankle - Sees Aluisio  . BENIGN PROSTATIC HYPERTROPHY 06/07/2007  . Cataract   . CELLULITIS/ABSCESS, TRUNK 07/27/2009  . Chronic sinusitis 06/23/2010  . Erectile dysfunction 06/23/2010  . GERD (gastroesophageal reflux disease) 06/17/2016  . Hearing loss    Bilateral does not wear hearing aids  . HYPERLIPIDEMIA 06/07/2007   Diet controlled- triglycerides are great   . Melanoma ([place]) 09/12/2011   Right arm, 2006 - Dr Kelle Darting -  pre melanoma   . Neuromuscular disorder (HCC)    neuropathy boot feet on toes   . OTITIS MEDIA, ACUTE, RIGHT 02/04/2009   Past Surgical History:  Procedure Laterality Date  .  basal cell removal      left ear  . cataract Bilateral   . COLONOSCOPY    . inguinal herniorrhapy     right and left  . LIPOMA EXCISION Left 12/2018   Left upper arm. Dr [facility]  . MELANOMA EXCISION  2005   right arm  . POLYPECTOMY    . s/p left shoulder lipoma    . UPPER GASTROINTESTINAL ENDOSCOPY  02/02/2017   Ardis Hughs     reports that he has quit smoking. His smoking use included cigars and pipe. He has never used smokeless tobacco. He reports current alcohol use of about 5.0 standard drinks of alcohol per week. He reports that he does not use drugs. family history includes Cancer in his mother; Colon cancer (age of onset: 45) in his mother; Heart attack in an other family member; Sudden death (age of onset: 70) in his father. No Known Allergies Current Outpatient Medications on File Prior to Visit  Medication Sig Dispense Refill  . Cetirizine HCl 10 MG TBDP Take 1 tablet by mouth daily.    . Cyanocobalamin ([facility]S VITAMIN B12 PO) Take 1 tablet by mouth daily.    . NON FORMULARY 500 mg daily. niacinamide    . pantoprazole (PROTONIX) 40 MG tablet Take 1 tablet (40 mg total) by mouth daily. 90 tablet 1  . VITAMIN D PO Take  1 tablet by mouth daily.    . Vitamin D, Ergocalciferol, (DRISDOL) 1.25 MG (50000 UT) CAPS capsule Take 1 capsule (50,000 Units total) by mouth every 7 (seven) days. 12 capsule 0  . Naproxen Sodium (ALEVE) 220 MG CAPS Aleve  prn     No current facility-administered medications on file prior to visit.   Review of Systems  Constitutional: Negative for other unusual diaphoresis or sweats HENT: Negative for ear discharge or swelling Eyes: Negative for other worsening visual disturbances Respiratory: Negative for stridor or other swelling  Gastrointestinal: Negative for worsening distension or other blood Genitourinary: Negative for retention or other urinary change Musculoskeletal: Negative for other MSK pain or swelling Skin: Negative for  color change or other new lesions Neurological: Negative for worsening tremors and other numbness  Psychiatric/Behavioral: Negative for worsening agitation or other fatigue All otherwise neg per pt     Objective:   Physical Exam BP (!) 144/82 (BP Location: Left Arm)   Pulse 75   Temp 98.7 F (37.1 C) (Oral)   Ht 5\' 11"  (1.803 m)   Wt 199 lb (90.3 kg)   SpO2 97%   BMI 27.75 kg/m  VS noted,  Constitutional: Pt appears in NAD HENT: Head: NCAT.  Right Ear: External ear normal.  Left Ear: External ear normal.  Eyes: . Pupils are equal, round, and reactive to light. Conjunctivae and EOM are normal Nose: without d/c or deformity Neck: Neck supple. Gross normal ROM Cardiovascular: Normal rate and regular rhythm.   Pulmonary/Chest: Effort normal and breath sounds without rales or wheezing.  Abd:  Soft, NT, ND, + BS, no organomegaly Neurological: Pt is alert. At baseline orientation, motor grossly intact Skin: Skin is warm. No rashes, other new lesions, no LE edema Psychiatric: Pt behavior is normal without agitation  All otherwise neg per pt Lab Results  Component Value Date   WBC 5.6 11/07/2018   HGB 15.5 11/07/2018   HCT 46.0 11/07/2018   PLT 266.0 11/07/2018   GLUCOSE 93 11/07/2018   CHOL 204 (H) 11/07/2018   TRIG 50.0 11/07/2018   HDL 73.50 11/07/2018   LDLDIRECT 132.6 09/12/2011   LDLCALC 121 (H) 11/07/2018   ALT 18 11/07/2018   AST 20 11/07/2018   NA 139 11/07/2018   K 4.2 11/07/2018   CL 101 11/07/2018   CREATININE 0.82 11/07/2018   BUN 15 11/07/2018   CO2 32 11/07/2018   TSH 1.69 11/07/2018   PSA 0.55 11/07/2018          Assessment & Plan:

## 2019-02-24 NOTE — Patient Instructions (Signed)
Please continue all other medications as before, including the oral Vit D and Vit B12  Please have the pharmacy call with any other refills you may need.  Please continue your efforts at being more active, low cholesterol diet, and weight control.  You are otherwise up to date with prevention measures today.  Please keep your appointments with your specialists as you may have planned  Please stop to make an appt with Dr Tamala Julian or Dr Amalia Hailey on the first floor (sports medicine) for the left shoulder  Please return for LAB only in 3 months  Please return in 6 months, or sooner if needed, with other Lab testing done 3-5 days before

## 2019-03-04 ENCOUNTER — Ambulatory Visit: Payer: Medicare Other | Admitting: Family Medicine

## 2019-03-04 ENCOUNTER — Encounter: Payer: Self-pay | Admitting: Family Medicine

## 2019-03-04 ENCOUNTER — Other Ambulatory Visit: Payer: Self-pay

## 2019-03-04 ENCOUNTER — Ambulatory Visit (INDEPENDENT_AMBULATORY_CARE_PROVIDER_SITE_OTHER): Payer: Medicare Other

## 2019-03-04 VITALS — BP 104/70 | HR 71 | Ht 71.0 in | Wt 197.0 lb

## 2019-03-04 DIAGNOSIS — M79622 Pain in left upper arm: Secondary | ICD-10-CM

## 2019-03-04 NOTE — Progress Notes (Signed)
Subjective:    CC: L shoulder pain  I, Shane Fischer, LAT, ATC, am serving as scribe for Dr. Lynne Fischer.  HPI: Pt is a 78 y/o male presenting w/ c/o L upper pain x 6 months.  Pt had a lipoma on/in his L upper arm approximately 25 years ago that was removed.  Pt states that another lipoma developed in 2020 that started causing pain in his L upper arm.  Pt states that his new lipoma was removed in October 2020 (Dr Rosendo Gros at [facility])  but the L upper arm pain remained.  L upper arm pain is aggravated by horizontal aDd and pushing up/off his L UE.  He has not tried any specific treatments.  Pt has no pain at rest but rates it as a sharp 5/10 at it's worst.  Pt denies any neck pain or mechanical shoulder symptoms.  He notes that he has not had x-rays or other imaging of his shoulder or upper arm as part of his work-up for lipoma or shoulder pain.  He has not had any specific treatment for his arm pain.  Past medical history, Surgical history, Family history not pertinant except as noted below, Social history, Allergies, and medications have been entered into the medical record, reviewed, and no changes needed.   Review of Systems: No headache, visual changes, nausea, vomiting, diarrhea, constipation, dizziness, abdominal pain, skin rash, fevers, chills, night sweats, weight loss, swollen lymph nodes, body aches, joint swelling, muscle aches, chest pain, shortness of breath, mood changes, visual or auditory hallucinations.   Objective:    Vitals:   03/04/19 1032  BP: 104/70  Pulse: 71  SpO2: 98%   General: Well Developed, well nourished, and in no acute distress.  Neuro/Psych: Alert and oriented x3, extra-ocular muscles intact, able to move all 4 extremities, sensation grossly intact. Skin: Warm and dry, no rashes noted.  Respiratory: Not using accessory muscles, speaking in full sentences, trachea midline.  Cardiovascular: Pulses palpable, no extremity edema. Abdomen:  Does not appear distended. MSK:  Left shoulder and upper arm Mature appearing scar on lateral upper arm nontender. Shoulder otherwise normal-appearing Shoulder motion: Abduction limited to 140 degrees active and passive. Internal rotation limited to lumbar spine. External rotation limited 30 degrees beyond neutral position. Crepitations audible with shoulder motion. Strength intact abduction external and internal rotation. Not particularly positive Hawkins or Neer's test.  Negative empty can test. Negative Yergason's and speeds test.  Lab and Radiology Results  X-ray images left shoulder and humerus obtained today personally independently reviewed.  Left shoulder: No acute fractures.  Minimal glenohumeral degenerative changes.  Moderate AC DJD.  Await formal radiology review  Limited musculoskeletal ultrasound left shoulder Biceps tendon normal-appearing intact in bicipital groove. Subscapularis tendon normal-appearing with no tears. Supraspinatus tendon normal-appearing with no tears. Infraspinatus tendon slightly thinned no visible tears.  Otherwise normal-appearing AC joint mild effusion osteophyte present indicating DJD. Posterior glenohumeral joint narrowed with spurring indicating DJD Bony structures otherwise normal-appearing Impression: Probable glenohumeral DJD.  Moderate AC DJD  Impression and Recommendations:    Assessment and Plan: 78 y.o. male with left shoulder pain. X-ray shows mild glenohumeral DJD however formal radiology review is still pending. Cause of pain is glenohumeral DJD .  Rotator cuff functionally normal within range of motion with normal strength and normal appearing ultrasound.  Discuss treatment plan and options.  Patient would like to pursue a more conservative approach.  We will proceed with exercise program taught by ATC in  clinic today, trial of diclofenac gel and a bit of watchful waiting.  Offered trial of diagnostic and therapeutic steroid  injection patient declined at this time.  Will recheck back as needed.  PDMP not reviewed this encounter. Orders Placed This Encounter  Procedures  . [place] - Upper Extremity - Limited - LEFT    Order Specific Question:   Reason for Exam (SYMPTOM  OR DIAGNOSIS REQUIRED)    Answer:   L upper arm pain    Order Specific Question:   Preferred imaging location?    Answer:   [place]  . DG Shoulder Left    Standing Status:   Future    Number of Occurrences:   1    Standing Expiration Date:   05/03/2020    Order Specific Question:   Reason for Exam (SYMPTOM  OR DIAGNOSIS REQUIRED)    Answer:   eval left shoudler pain    Order Specific Question:   Preferred imaging location?    Answer:   [name]    Order Specific Question:   Radiology Contrast Protocol - do NOT remove file path    Answer:   \\[email or web address]   No orders of the defined types were placed in this encounter.   Discussed warning signs or symptoms. Please see discharge instructions. Patient expresses understanding.   The above documentation has been reviewed and is accurate and complete Shane Fischer

## 2019-03-04 NOTE — Progress Notes (Signed)
Radiology did not see a lot of arthritis in the shoulder joint.  Reasonable to proceed with injection if desired in the near future.  Okay to reschedule for that.

## 2019-03-04 NOTE — Patient Instructions (Addendum)
Thank you for coming in today. Get xray today on your way out.  Use over the counter voltaren gel for pain up to 4x daily.  Do the exercises we discussed.  Recheck with me as needed for the arm pain or anything else.    Please perform the exercise program that we have prepared for you and gone over in detail on a daily basis.  In addition to the handout you were provided you can access your program through: [email or web address]   Your unique program code is:  [email or web address]

## 2019-03-05 DIAGNOSIS — M79605 Pain in left leg: Secondary | ICD-10-CM | POA: Diagnosis not present

## 2019-03-05 DIAGNOSIS — S76111A Strain of right quadriceps muscle, fascia and tendon, initial encounter: Secondary | ICD-10-CM | POA: Diagnosis not present

## 2019-03-05 DIAGNOSIS — M19071 Primary osteoarthritis, right ankle and foot: Secondary | ICD-10-CM | POA: Diagnosis not present

## 2019-03-09 ENCOUNTER — Encounter: Payer: Self-pay | Admitting: Internal Medicine

## 2019-03-09 DIAGNOSIS — E538 Deficiency of other specified B group vitamins: Secondary | ICD-10-CM | POA: Insufficient documentation

## 2019-03-09 DIAGNOSIS — M25512 Pain in left shoulder: Secondary | ICD-10-CM | POA: Insufficient documentation

## 2019-03-09 DIAGNOSIS — E559 Vitamin D deficiency, unspecified: Secondary | ICD-10-CM | POA: Insufficient documentation

## 2019-03-09 NOTE — Assessment & Plan Note (Signed)
stable overall by history and exam, recent data reviewed with pt, and pt to continue medical treatment as before,  to f/u any worsening symptoms or concerns  

## 2019-03-09 NOTE — Assessment & Plan Note (Signed)
To cont oral supplement with otc vit d3 2000 u qd

## 2019-03-09 NOTE — Assessment & Plan Note (Signed)
Mild, pt states better controlled at home, declines med change, will f/u BP at home further and next visit, low salt diet

## 2019-03-09 NOTE — Assessment & Plan Note (Signed)
C/w left shoulder impingement syndrome, for sport med or ortho f/u,  to f/u any worsening symptoms or concerns

## 2019-03-09 NOTE — Assessment & Plan Note (Signed)
To cont oral replacement,  to f/u any worsening symptoms or concerns

## 2019-03-28 ENCOUNTER — Ambulatory Visit: Payer: Medicare Other | Attending: Internal Medicine

## 2019-03-28 DIAGNOSIS — Z23 Encounter for immunization: Secondary | ICD-10-CM | POA: Insufficient documentation

## 2019-03-28 NOTE — Progress Notes (Signed)
   Covid-19 Vaccination Clinic  Name:  Shane Fischer    MRN: [id number] DOB: 1940/04/11  03/28/2019  Shane Fischer was observed post Covid-19 immunization for 15 minutes without incidence. He was provided with Vaccine Information Sheet and instruction to access the V-Safe system.   Shane Fischer was instructed to call 911 with any severe reactions post vaccine: Marland Kitchen Difficulty breathing  . Swelling of your face and throat  . A fast heartbeat  . A bad rash all over your body  . Dizziness and weakness    Immunizations Administered    Name Date Dose VIS Date Route   Pfizer COVID-19 Vaccine 03/28/2019  6:32 PM 0.3 mL 02/14/2019 Intramuscular   Manufacturer: [place]   Lot: [id number]   [place]: [id number]

## 2019-04-14 DIAGNOSIS — H43813 Vitreous degeneration, bilateral: Secondary | ICD-10-CM | POA: Diagnosis not present

## 2019-04-14 DIAGNOSIS — H35371 Puckering of macula, right eye: Secondary | ICD-10-CM | POA: Diagnosis not present

## 2019-04-14 DIAGNOSIS — Z961 Presence of intraocular lens: Secondary | ICD-10-CM | POA: Diagnosis not present

## 2019-04-14 DIAGNOSIS — H26492 Other secondary cataract, left eye: Secondary | ICD-10-CM | POA: Diagnosis not present

## 2019-04-16 ENCOUNTER — Telehealth: Payer: Self-pay | Admitting: Internal Medicine

## 2019-04-16 MED ORDER — PANTOPRAZOLE SODIUM 40 MG PO TBEC: 40.0000 mg | DELAYED_RELEASE_TABLET | Freq: Every day | ORAL | 3 refills | Status: AC

## 2019-04-16 NOTE — Telephone Encounter (Signed)
        1. Which medications need to be refilled? (please list name of each medication and dose if known) pantoprazole (PROTONIX) 40 MG tablet  2. Which pharmacy/location (including street and city if local pharmacy) is medication to be sent to?Shane Fischer [facility] [address], [place]  3. Do they need a 30 day or 90 day supply? [place]

## 2019-04-17 DIAGNOSIS — M19071 Primary osteoarthritis, right ankle and foot: Secondary | ICD-10-CM | POA: Diagnosis not present

## 2019-04-18 ENCOUNTER — Ambulatory Visit: Payer: Medicare Other | Attending: Internal Medicine

## 2019-04-18 DIAGNOSIS — Z23 Encounter for immunization: Secondary | ICD-10-CM

## 2019-04-18 NOTE — Progress Notes (Signed)
   Covid-19 Vaccination Clinic  Name:  Shane Fischer    MRN: [id number] DOB: 03/11/1940  04/18/2019  [name] was observed post Covid-19 immunization for 15 minutes without incidence. He was provided with Vaccine Information Sheet and instruction to access the V-Safe system.   [name] was instructed to call 911 with any severe reactions post vaccine: Marland Kitchen Difficulty breathing  . Swelling of your face and throat  . A fast heartbeat  . A bad rash all over your body  . Dizziness and weakness    Immunizations Administered    Name Date Dose VIS Date Route   Pfizer COVID-19 Vaccine 04/18/2019 11:19 AM 0.3 mL 02/14/2019 Intramuscular   Manufacturer: [place]   Lot: [id number]   [place]: [id number]

## 2019-04-21 DIAGNOSIS — H26491 Other secondary cataract, right eye: Secondary | ICD-10-CM | POA: Diagnosis not present

## 2019-05-12 IMAGING — NM NM MISC PROCEDURE
8 series · 48 of 48 positions shown · non-contrast
Comparison: none

[Series 1: wbr_r-proj_st rest · 6.51mm/px · 6 of 64 frames shown (1 of 2)]
[frame 6/64]
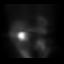
[frame 16/64]
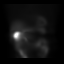
[frame 27/64]
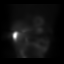
[frame 38/64]
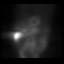
[frame 48/64]
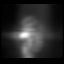
[frame 59/64]
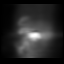

[Series 1: rest · 6.51mm/px · 6 of 64 frames shown (1 of 2)]
[frame 6/64]
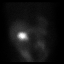
[frame 16/64]
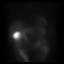
[frame 27/64]
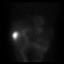
[frame 38/64]
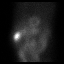
[frame 48/64]
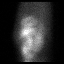
[frame 59/64]
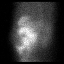

[Series 2: wbr_r-proj_st rest · 6.51mm/px · 6 of 64 frames shown (2 of 2)]
[frame 6/64]
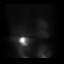
[frame 16/64]
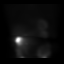
[frame 27/64]
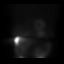
[frame 38/64]
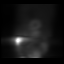
[frame 48/64]
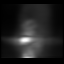
[frame 59/64]
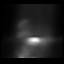

[Series 2: rest · 6.51mm/px · 6 of 64 frames shown (2 of 2)]
[frame 6/64]
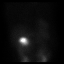
[frame 16/64]
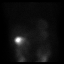
[frame 27/64]
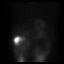
[frame 38/64]
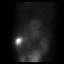
[frame 48/64]
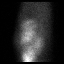
[frame 59/64]
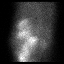

[Series 3: stress - gated · 6.51mm/px · 6 of 512 frames shown]
[frame 43/512]
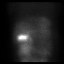
[frame 128/512]
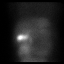
[frame 214/512]
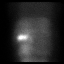
[frame 299/512]
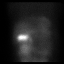
[frame 384/512]
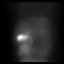
[frame 470/512]
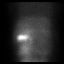

[Series 3: wbr_s-proj_st stress - gated · 6.51mm/px · 6 of 512 frames shown]
[frame 43/512]
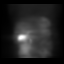
[frame 128/512]
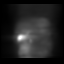
[frame 214/512]
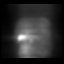
[frame 299/512]
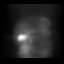
[frame 384/512]
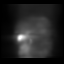
[frame 470/512]
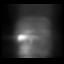

[Series 4: wbr_s-proj_st stress - perfusion · 6.51mm/px · 6 of 64 frames shown]
[frame 6/64]
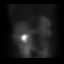
[frame 16/64]
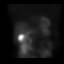
[frame 27/64]
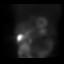
[frame 38/64]
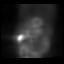
[frame 48/64]
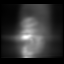
[frame 59/64]
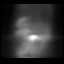

[Series 4: stress - perfusion · 6.51mm/px · 6 of 64 frames shown]
[frame 6/64]
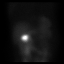
[frame 16/64]
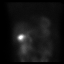
[frame 27/64]
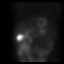
[frame 38/64]
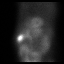
[frame 48/64]
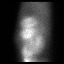
[frame 59/64]
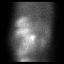

[48 of 48 positions shown; findings below may reference images not displayed]

Canned report from images found in remote index.

Refer to host system for actual result text.

## 2019-06-02 ENCOUNTER — Other Ambulatory Visit (INDEPENDENT_AMBULATORY_CARE_PROVIDER_SITE_OTHER): Payer: Medicare Other

## 2019-06-02 ENCOUNTER — Other Ambulatory Visit: Payer: Self-pay

## 2019-06-02 ENCOUNTER — Encounter: Payer: Self-pay | Admitting: Internal Medicine

## 2019-06-02 ENCOUNTER — Ambulatory Visit (INDEPENDENT_AMBULATORY_CARE_PROVIDER_SITE_OTHER): Payer: Medicare Other | Admitting: Internal Medicine

## 2019-06-02 DIAGNOSIS — R32 Unspecified urinary incontinence: Secondary | ICD-10-CM

## 2019-06-02 DIAGNOSIS — R399 Unspecified symptoms and signs involving the genitourinary system: Secondary | ICD-10-CM

## 2019-06-02 DIAGNOSIS — R030 Elevated blood-pressure reading, without diagnosis of hypertension: Secondary | ICD-10-CM | POA: Diagnosis not present

## 2019-06-02 NOTE — Assessment & Plan Note (Signed)
Consider flomax trial,  to f/u any worsening symptoms or concerns

## 2019-06-02 NOTE — Progress Notes (Signed)
Subjective:    Patient ID: Shane Fischer, male    DOB: Sep 27, 1940, 79 y.o.   MRN: [id number]  HPI  Here to f/u; overall doing ok,  Pt denies chest pain, increasing sob or doe, wheezing, orthopnea, PND, increased LE swelling, palpitations, dizziness or syncope.  Pt denies new neurological symptoms such as new headache, or facial or extremity weakness or numbness.  Pt denies polydipsia, polyuria, or low sugar episode.  Pt states overall good compliance with meds, Denies urinary symptoms such as dysuria, urgency, flank pain, hematuria or n/v, fever, chills but has 2 mo gradually worsening now 2 wks frequent urinary frequency, dribbling at end and frank incontinence and nocturia 2x per night.   Past Medical History:  Diagnosis Date  . ALLERGIC RHINITIS 06/07/2007  . Allergy   . Arthritis    right ankle - Sees Aluisio  . BENIGN PROSTATIC HYPERTROPHY 06/07/2007  . Cataract   . CELLULITIS/ABSCESS, TRUNK 07/27/2009  . Chronic sinusitis 06/23/2010  . Erectile dysfunction 06/23/2010  . GERD (gastroesophageal reflux disease) 06/17/2016  . Hearing loss    Bilateral does not wear hearing aids  . HYPERLIPIDEMIA 06/07/2007   Diet controlled- triglycerides are great   . Melanoma ([place] [facility]) 09/12/2011   Right arm, 2006 - Dr Kelle Darting -  pre melanoma   . Neuromuscular disorder (HCC)    neuropathy boot feet on toes   . OTITIS MEDIA, ACUTE, RIGHT 02/04/2009   Past Surgical History:  Procedure Laterality Date  . basal cell removal      left ear  . cataract Bilateral   . COLONOSCOPY    . inguinal herniorrhapy     right and left  . LIPOMA EXCISION Left 12/2018   Left upper arm. Dr [facility]  . MELANOMA EXCISION  2005   right arm  . POLYPECTOMY    . s/p left shoulder lipoma    . UPPER GASTROINTESTINAL ENDOSCOPY  02/02/2017   Ardis Hughs     reports that he has quit smoking. His smoking use included cigars and pipe. He has never used smokeless tobacco. He reports current alcohol use of about  5.0 standard drinks of alcohol per week. He reports that he does not use drugs. family history includes Cancer in his mother; Colon cancer (age of onset: 75) in his mother; Heart attack in an other family member; Sudden death (age of onset: 26) in his father. No Known Allergies Current Outpatient Medications on File Prior to Visit  Medication Sig Dispense Refill  . Cetirizine HCl 10 MG TBDP Take 1 tablet by mouth daily.    . Cyanocobalamin ([facility]S VITAMIN B12 PO) Take 1 tablet by mouth daily.    . Naproxen Sodium (ALEVE) 220 MG CAPS Aleve  prn    . NON FORMULARY 500 mg daily. niacinamide    . pantoprazole (PROTONIX) 40 MG tablet Take 1 tablet (40 mg total) by mouth daily. 90 tablet 3  . VITAMIN D PO Take 1 tablet by mouth daily.    . Vitamin D, Ergocalciferol, (DRISDOL) 1.25 MG (50000 UT) CAPS capsule Take 1 capsule (50,000 Units total) by mouth every 7 (seven) days. 12 capsule 0   No current facility-administered medications on file prior to visit.   Review of Systems All otherwise neg per pt     Objective:   Physical Exam BP (!) 142/82   Pulse 66   Temp 98.8 F (37.1 C)   Ht 5\' 11"  (1.803 m)   Wt 194 lb (88 kg)  SpO2 99%   BMI 27.06 kg/m  VS noted,  Constitutional: Pt appears in NAD HENT: Head: NCAT.  Right Ear: External ear normal.  Left Ear: External ear normal.  Eyes: . Pupils are equal, round, and reactive to light. Conjunctivae and EOM are normal Nose: without d/c or deformity Neck: Neck supple. Gross normal ROM Cardiovascular: Normal rate and regular rhythm.   Pulmonary/Chest: Effort normal and breath sounds without rales or wheezing.  Abd:  Soft, NT, ND, + BS, no organomegaly Neurological: Pt is alert. At baseline orientation, motor grossly intact Skin: Skin is warm. No rashes, other new lesions, no LE edema Psychiatric: Pt behavior is normal without agitation  All otherwise neg per pt Lab Results  Component Value Date   WBC 5.6 11/07/2018   HGB 15.5 11/07/2018    HCT 46.0 11/07/2018   PLT 266.0 11/07/2018   GLUCOSE 93 11/07/2018   CHOL 204 (H) 11/07/2018   TRIG 50.0 11/07/2018   HDL 73.50 11/07/2018   LDLDIRECT 132.6 09/12/2011   LDLCALC 121 (H) 11/07/2018   ALT 18 11/07/2018   AST 20 11/07/2018   NA 139 11/07/2018   K 4.2 11/07/2018   CL 101 11/07/2018   CREATININE 0.82 11/07/2018   BUN 15 11/07/2018   CO2 32 11/07/2018   TSH 1.69 11/07/2018   PSA 0.55 11/07/2018      Assessment & Plan:

## 2019-06-02 NOTE — Assessment & Plan Note (Signed)
Mild, declines change in tx at this tme

## 2019-06-02 NOTE — Patient Instructions (Signed)
Please continue all other medications as before, and refills have been done if requested.  Please have the pharmacy call with any other refills you may need.  Please continue your efforts at being more active, low cholesterol diet, and weight control.  Please keep your appointments with your specialists as you may have planned  Please go to the LAB at the blood drawing area for the tests to be done - just the urine testing at the [facility] will be contacted by phone if any changes need to be made immediately.  Otherwise, you will receive a letter about your results with an explanation, but please check with MyChart first.  Please remember to sign up for MyChart if you have not done so, as this will be important to you in the future with finding out test results, communicating by private email, and scheduling acute appointments online when needed.  You will be contacted regarding the referral for: Urology

## 2019-06-02 NOTE — Assessment & Plan Note (Addendum)
Etiology unclear, cant r/o infection, for urine studies, refer urology  I spent 31 minutes in preparing to see the patient by review of recent labs, imaging and procedures, obtaining and reviewing separately obtained history, communicating with the patient and family or caregiver, ordering medications, tests or procedures, and documenting clinical information in the EHR including the differential Dx, treatment, and any further evaluation and other management of luts, incontinence, elevated bp

## 2019-06-03 LAB — URINALYSIS, ROUTINE W REFLEX MICROSCOPIC
Bilirubin Urine: NEGATIVE
Hgb urine dipstick: NEGATIVE
Leukocytes,Ua: NEGATIVE
Nitrite: NEGATIVE
Specific Gravity, Urine: 1.02 (ref 1.000–1.030)
Total Protein, Urine: NEGATIVE
Urine Glucose: NEGATIVE
Urobilinogen, UA: 0.2 (ref 0.0–1.0)
pH: 6.5 (ref 5.0–8.0)

## 2019-06-04 LAB — URINE CULTURE
MICRO NUMBER:: 10302552
Result:: NO GROWTH
SPECIMEN QUALITY:: ADEQUATE

## 2019-06-20 DIAGNOSIS — M5416 Radiculopathy, lumbar region: Secondary | ICD-10-CM | POA: Diagnosis not present

## 2019-06-20 DIAGNOSIS — M25552 Pain in left hip: Secondary | ICD-10-CM | POA: Diagnosis not present

## 2019-07-01 DIAGNOSIS — M79652 Pain in left thigh: Secondary | ICD-10-CM | POA: Diagnosis not present

## 2019-07-07 ENCOUNTER — Other Ambulatory Visit: Payer: Self-pay

## 2019-07-07 ENCOUNTER — Ambulatory Visit (INDEPENDENT_AMBULATORY_CARE_PROVIDER_SITE_OTHER): Payer: Medicare Other | Admitting: Family

## 2019-07-07 VITALS — BP 148/72 | HR 75 | Temp 97.7°F | Ht 71.0 in | Wt 195.6 lb

## 2019-07-07 DIAGNOSIS — N451 Epididymitis: Secondary | ICD-10-CM | POA: Diagnosis not present

## 2019-07-07 DIAGNOSIS — R21 Rash and other nonspecific skin eruption: Secondary | ICD-10-CM | POA: Diagnosis not present

## 2019-07-07 MED ORDER — NYSTATIN-TRIAMCINOLONE 100000-0.1 UNIT/GM-% EX CREA: 1.0000 "application " | TOPICAL_CREAM | Freq: Two times a day (BID) | CUTANEOUS | 0 refills | Status: AC

## 2019-07-07 MED ORDER — DOXYCYCLINE HYCLATE 100 MG PO TABS: 100.0000 mg | ORAL_TABLET | Freq: Two times a day (BID) | ORAL | 0 refills | Status: DC

## 2019-07-07 NOTE — Progress Notes (Signed)
Shane Fischer is a 79 y.o. male with the following history as recorded in EpicCare:  Patient Active Problem List   Diagnosis Date Noted  . Incontinence 06/02/2019  . Vitamin D deficiency 03/09/2019  . B12 deficiency 03/09/2019  . Left shoulder pain 03/09/2019  . Lesion of left pinna 11/07/2018  . Arthritis of right ankle 11/07/2018  . Chest pain 04/03/2017  . Chest tightness or pressure 03/27/2017  . Rash 08/10/2016  . Constipation 06/17/2016  . GERD (gastroesophageal reflux disease) 06/17/2016  . Tick bite 09/18/2015  . Prostatitis 07/10/2015  . Vertigo 05/05/2015  . Hematuria 11/05/2012  . Melanoma ([place]) 09/12/2011  . Chronic sinusitis 06/23/2010  . Erectile dysfunction 06/23/2010  . Encounter for well adult exam with abnormal findings 06/18/2010  . Blood pressure elevated without history of HTN 02/04/2009  . HLD (hyperlipidemia) 06/07/2007  . ALLERGIC RHINITIS 06/07/2007  . Lower urinary tract symptoms (LUTS) 06/07/2007    Current Outpatient Medications  Medication Sig Dispense Refill  . Cetirizine HCl 10 MG TBDP Take 1 tablet by mouth daily.    . Cyanocobalamin (CVS VITAMIN B12 PO) Take 1 tablet by mouth daily.    . Naproxen Sodium (ALEVE) 220 MG CAPS Aleve  prn    . NON FORMULARY 500 mg daily. niacinamide    . pantoprazole (PROTONIX) 40 MG tablet Take 1 tablet (40 mg total) by mouth daily. 90 tablet 3  . VITAMIN D PO Take 1 tablet by mouth daily.    . Vitamin D, Ergocalciferol, (DRISDOL) 1.25 MG (50000 UT) CAPS capsule Take 1 capsule (50,000 Units total) by mouth every 7 (seven) days. 12 capsule 0  . doxycycline (VIBRA-TABS) 100 MG tablet Take 1 tablet (100 mg total) by mouth 2 (two) times daily. 28 tablet 0  . nystatin-triamcinolone (MYCOLOG II) cream Apply 1 application topically 2 (two) times daily. 30 g 0   No current facility-administered medications for this visit.    Allergies: Patient has no known allergies.  Past Medical History:  Diagnosis Date  .  ALLERGIC RHINITIS 06/07/2007  . Allergy   . Arthritis    right ankle - Sees Aluisio  . BENIGN PROSTATIC HYPERTROPHY 06/07/2007  . Cataract   . CELLULITIS/ABSCESS, TRUNK 07/27/2009  . Chronic sinusitis 06/23/2010  . Erectile dysfunction 06/23/2010  . GERD (gastroesophageal reflux disease) 06/17/2016  . Hearing loss    Bilateral does not wear hearing aids  . HYPERLIPIDEMIA 06/07/2007   Diet controlled- triglycerides are great   . Melanoma ([place]) 09/12/2011   Right arm, 2006 - Dr Kelle Darting -  pre melanoma   . Neuromuscular disorder (HCC)    neuropathy boot feet on toes   . OTITIS MEDIA, ACUTE, RIGHT 02/04/2009    Past Surgical History:  Procedure Laterality Date  . basal cell removal      left ear  . cataract Bilateral   . COLONOSCOPY    . inguinal herniorrhapy     right and left  . LIPOMA EXCISION Left 12/2018   Left upper arm. Dr [facility]  . MELANOMA EXCISION  2005   right arm  . POLYPECTOMY    . s/p left shoulder lipoma    . UPPER GASTROINTESTINAL ENDOSCOPY  02/02/2017   Ardis Hughs     Family History  Problem Relation Age of Onset  . Cancer Mother        colon  . Colon cancer Mother 62  . Sudden death Father 45  . Heart attack Other   . Stomach  cancer Neg Hx   . Esophageal cancer Neg Hx   . Pancreatic cancer Neg Hx   . Colon polyps Neg Hx   . Rectal cancer Neg Hx     Social History   Tobacco Use  . Smoking status: Former Smoker    Types: Cigars, Pipe  . Smokeless tobacco: Never Used  Substance Use Topics  . Alcohol use: Yes    Alcohol/week: 5.0 standard drinks    Types: 5 Glasses of wine per week    Comment: occ    Subjective:  Patient presents with concerns for possible testicular infection. Has history of epididymitis- is suspicious that symptoms have re-flared; last episode almost 2 years ago; right sided testicular pain/ felt feverish over the weekend;  Is scheduled to see urology later this month for possible prostate issues;     Objective:  Vitals:   07/07/19 1345  BP: (!) 148/72  Pulse: 75  Temp: 97.7 F (36.5 C)  TempSrc: Oral  SpO2: 99%  Weight: 195 lb 9.6 oz (88.7 kg)  Height: 5\' 11"  (1.803 m)    General: Well developed, well nourished, in no acute distress  Skin : Warm and dry. Rash noted bilateral in inguinal areas Head: Normocephalic and atraumatic  Lungs: Respirations unlabored; clear to auscultation bilaterally without wheeze, rales, rhonchi  Neurologic: Alert and oriented; speech intact; face symmetrical; moves all extremities well; CNII-XII intact without focal deficit  No testicular abnormality noted on exam;   Assessment:  1. Epididymitis   2. Rash     Plan:  1. Rx for Doxycycline 100 mg bid x 14 days which has been beneficial in the past; keep planned follow-up with urology. 2. Suspect fungal component; trial of Mycolog II apply bid to affected area;  This visit occurred during the SARS-CoV-2 public health emergency.  Safety protocols were in place, including screening questions prior to the visit, additional usage of staff PPE, and extensive cleaning of exam room while observing appropriate contact time as indicated for disinfecting solutions.     No follow-ups on file.  No orders of the defined types were placed in this encounter.   Requested Prescriptions   Signed Prescriptions Disp Refills  . doxycycline (VIBRA-TABS) 100 MG tablet 28 tablet 0    Sig: Take 1 tablet (100 mg total) by mouth 2 (two) times daily.  Marland Kitchen nystatin-triamcinolone (MYCOLOG II) cream 30 g 0    Sig: Apply 1 application topically 2 (two) times daily.

## 2019-07-15 DIAGNOSIS — M5416 Radiculopathy, lumbar region: Secondary | ICD-10-CM | POA: Diagnosis not present

## 2019-07-21 ENCOUNTER — Other Ambulatory Visit: Payer: Self-pay | Admitting: Family

## 2019-07-21 ENCOUNTER — Telehealth: Payer: Self-pay | Admitting: Internal Medicine

## 2019-07-21 MED ORDER — DOXYCYCLINE HYCLATE 100 MG PO TABS
100.0000 mg | ORAL_TABLET | Freq: Two times a day (BID) | ORAL | 0 refills | Status: DC
Start: 2019-07-21 — End: 2019-08-11

## 2019-07-21 NOTE — Telephone Encounter (Signed)
Done erx 

## 2019-07-21 NOTE — Telephone Encounter (Signed)
    Patient calling to request doxycycline (VIBRA-TABS) 100 MG tablet be refilled for worsening Epididymitis. Appointment made for 05/18

## 2019-07-21 NOTE — Telephone Encounter (Signed)
New message:   Pt would like to discuss a treatment that he is currently getting from the NP. Please advise.

## 2019-07-22 ENCOUNTER — Ambulatory Visit: Payer: Medicare Other | Admitting: Internal Medicine

## 2019-07-22 NOTE — Telephone Encounter (Signed)
Spoke with patient and info given 

## 2019-08-07 ENCOUNTER — Other Ambulatory Visit: Payer: Self-pay | Admitting: Internal Medicine

## 2019-08-08 ENCOUNTER — Ambulatory Visit: Payer: Medicare Other | Admitting: Internal Medicine

## 2019-08-08 ENCOUNTER — Other Ambulatory Visit: Payer: Self-pay | Admitting: Internal Medicine

## 2019-08-08 NOTE — Telephone Encounter (Signed)
Ok to let pt know that he should not need further antibiotic at this point, as we should hold, and follow for any worsening pain, fever, or other unusual symptoms

## 2019-08-11 ENCOUNTER — Ambulatory Visit (INDEPENDENT_AMBULATORY_CARE_PROVIDER_SITE_OTHER): Payer: Medicare Other

## 2019-08-11 ENCOUNTER — Other Ambulatory Visit: Payer: Self-pay

## 2019-08-11 ENCOUNTER — Ambulatory Visit (INDEPENDENT_AMBULATORY_CARE_PROVIDER_SITE_OTHER): Payer: Medicare Other | Admitting: Family

## 2019-08-11 ENCOUNTER — Ambulatory Visit: Payer: Medicare Other | Admitting: Family

## 2019-08-11 ENCOUNTER — Encounter: Payer: Self-pay | Admitting: Family

## 2019-08-11 VITALS — BP 138/82 | HR 79 | Temp 98.7°F | Wt 193.8 lb

## 2019-08-11 DIAGNOSIS — R079 Chest pain, unspecified: Secondary | ICD-10-CM | POA: Diagnosis not present

## 2019-08-11 DIAGNOSIS — R300 Dysuria: Secondary | ICD-10-CM

## 2019-08-11 DIAGNOSIS — R0789 Other chest pain: Secondary | ICD-10-CM

## 2019-08-11 DIAGNOSIS — N451 Epididymitis: Secondary | ICD-10-CM

## 2019-08-11 LAB — PSA: PSA: 1.08 ng/mL (ref 0.10–4.00)

## 2019-08-11 LAB — CBC WITH DIFFERENTIAL/PLATELET
Basophils Absolute: 0 10*3/uL (ref 0.0–0.1)
Basophils Relative: 0.7 % (ref 0.0–3.0)
Eosinophils Absolute: 0 10*3/uL (ref 0.0–0.7)
Eosinophils Relative: 0.7 % (ref 0.0–5.0)
HCT: 45.4 % (ref 39.0–52.0)
Hemoglobin: 15.2 g/dL (ref 13.0–17.0)
Lymphocytes Relative: 22.5 % (ref 12.0–46.0)
Lymphs Abs: 1.3 10*3/uL (ref 0.7–4.0)
MCHC: 33.4 g/dL (ref 30.0–36.0)
MCV: 99.9 fl (ref 78.0–100.0)
Monocytes Absolute: 0.6 10*3/uL (ref 0.1–1.0)
Monocytes Relative: 10.4 % (ref 3.0–12.0)
Neutro Abs: 3.9 10*3/uL (ref 1.4–7.7)
Neutrophils Relative %: 65.7 % (ref 43.0–77.0)
Platelets: 246 10*3/uL (ref 150.0–400.0)
RBC: 4.55 Mil/uL (ref 4.22–5.81)
RDW: 14.7 % (ref 11.5–15.5)
WBC: 5.9 10*3/uL (ref 4.0–10.5)

## 2019-08-11 LAB — COMPREHENSIVE METABOLIC PANEL
ALT: 18 U/L (ref 0–53)
AST: 19 U/L (ref 0–37)
Albumin: 4.4 g/dL (ref 3.5–5.2)
Alkaline Phosphatase: 41 U/L (ref 39–117)
BUN: 20 mg/dL (ref 6–23)
CO2: 30 mEq/L (ref 19–32)
Calcium: 8.9 mg/dL (ref 8.4–10.5)
Chloride: 101 mEq/L (ref 96–112)
Creatinine, Ser: 0.88 mg/dL (ref 0.40–1.50)
GFR: 84.04 mL/min (ref >60.00)
Glucose, Bld: 84 mg/dL (ref 70–99)
Potassium: 4.8 mEq/L (ref 3.5–5.1)
Sodium: 137 mEq/L (ref 135–145)
Total Bilirubin: 1.5 mg/dL — ABNORMAL HIGH (ref 0.2–1.2)
Total Protein: 6.5 g/dL (ref 6.0–8.3)

## 2019-08-11 LAB — URINALYSIS
Bilirubin Urine: NEGATIVE
Ketones, ur: 15 — AB
Leukocytes,Ua: NEGATIVE
Nitrite: NEGATIVE
Specific Gravity, Urine: >=1.03 — AB (ref 1.000–1.030)
Total Protein, Urine: NEGATIVE
Urine Glucose: NEGATIVE
Urobilinogen, UA: 0.2 (ref 0.0–1.0)
pH: 5.5 (ref 5.0–8.0)

## 2019-08-11 MED ORDER — DOXYCYCLINE HYCLATE 100 MG PO TABS: 100.0000 mg | ORAL_TABLET | Freq: Two times a day (BID) | ORAL | 0 refills | Status: AC

## 2019-08-11 NOTE — Progress Notes (Signed)
Shane Fischer is a 79 y.o. male with the following history as recorded in Epic[employer]:  Patient Active Problem List   Diagnosis Date Noted  . Incontinence 06/02/2019  . Vitamin D deficiency 03/09/2019  . B12 deficiency 03/09/2019  . Left shoulder pain 03/09/2019  . Lesion of left pinna 11/07/2018  . Arthritis of right ankle 11/07/2018  . Chest pain 04/03/2017  . Chest tightness or pressure 03/27/2017  . Rash 08/10/2016  . Constipation 06/17/2016  . GERD (gastroesophageal reflux disease) 06/17/2016  . Tick bite 09/18/2015  . Prostatitis 07/10/2015  . Vertigo 05/05/2015  . Hematuria 11/05/2012  . Melanoma ([place]) 09/12/2011  . Chronic sinusitis 06/23/2010  . Erectile dysfunction 06/23/2010  . Encounter for well adult exam with abnormal findings 06/18/2010  . Blood pressure elevated without history of HTN 02/04/2009  . HLD (hyperlipidemia) 06/07/2007  . ALLERGIC RHINITIS 06/07/2007  . Lower urinary tract symptoms (LUTS) 06/07/2007    Current Outpatient Medications  Medication Sig Dispense Refill  . Cetirizine HCl 10 MG TBDP Take 1 tablet by mouth daily.    . Cyanocobalamin ([facility] VITAMIN B12 PO) Take 1 tablet by mouth daily.    . Naproxen Sodium (ALEVE) 220 MG CAPS Aleve  prn    . NON FORMULARY 500 mg daily. niacinamide    . nystatin-triamcinolone (MYCOLOG II) cream Apply 1 application topically 2 (two) times daily. 30 g 0  . pantoprazole (PROTONIX) 40 MG tablet Take 1 tablet (40 mg total) by mouth daily. 90 tablet 3  . VITAMIN D PO Take 1 tablet by mouth daily.    Marland Kitchen doxycycline (VIBRA-TABS) 100 MG tablet Take 1 tablet (100 mg total) by mouth 2 (two) times daily. 28 tablet 0   No current facility-administered medications for this visit.    Allergies: Patient has no known allergies.  Past Medical History:  Diagnosis Date  . ALLERGIC RHINITIS 06/07/2007  . Allergy   . Arthritis    right ankle - Sees Aluisio  . BENIGN PROSTATIC HYPERTROPHY 06/07/2007  . Cataract   .  CELLULITIS/ABSCESS, TRUNK 07/27/2009  . Chronic sinusitis 06/23/2010  . Erectile dysfunction 06/23/2010  . GERD (gastroesophageal reflux disease) 06/17/2016  . Hearing loss    Bilateral does not wear hearing aids  . HYPERLIPIDEMIA 06/07/2007   Diet controlled- triglycerides are great   . Melanoma ([place]) 09/12/2011   Right arm, 2006 - Dr Kelle Darting -  pre melanoma   . Neuromuscular disorder ([facility])    neuropathy boot feet on toes   . OTITIS MEDIA, ACUTE, RIGHT 02/04/2009    Past Surgical History:  Procedure Laterality Date  . basal cell removal      left ear  . cataract Bilateral   . COLONOSCOPY    . inguinal herniorrhapy     right and left  . LIPOMA EXCISION Left 12/2018   Left upper arm. Dr [facility]  . MELANOMA EXCISION  2005   right arm  . POLYPECTOMY    . s/p left shoulder lipoma    . UPPER GASTROINTESTINAL ENDOSCOPY  02/02/2017   Ardis Hughs     Family History  Problem Relation Age of Onset  . Cancer Mother        colon  . Colon cancer Mother 58  . Sudden death Father 63  . Heart attack Other   . Stomach cancer Neg Hx   . Esophageal cancer Neg Hx   . Pancreatic cancer Neg Hx   . Colon polyps Neg Hx   .  Rectal cancer Neg Hx     Social History   Tobacco Use  . Smoking status: Former Smoker    Types: Cigars, Pipe  . Smokeless tobacco: Never Used  Substance Use Topics  . Alcohol use: Yes    Alcohol/week: 5.0 standard drinks    Types: 5 Glasses of wine per week    Comment: occ    Subjective:  Patient has had problems with recurrent epididymitis; has now completed 2 rounds of Doxycycline and is feeling better but does not feel that symptoms have cleared completely; requesting treatment with 3rd round of antibiotics; has seen urology in the past about this similar symptom;   Also notes "vague" chest pain on Friday/ Saturday; most noticeable with certain movements; did have to do some heavy lifting prior to onset of symptoms- required "pulling" of bolts;  no chest pain on exertion or shortness of breath of dizziness; not having symptoms today; history of hiatal hernia; normal cardiology work up in 2019 for atypical chest pain at that time as well.      Objective:  Vitals:   08/11/19 1129  BP: 138/82  Pulse: 79  Temp: 98.7 F (37.1 C)  TempSrc: Oral  SpO2: 96%  Weight: 193 lb 12.8 oz (87.9 kg)    General: Well developed, well nourished, in no acute distress  Skin : Warm and dry.  Head: Normocephalic and atraumatic  Eyes: Sclera and conjunctiva clear; pupils round and reactive to light; extraocular movements intact  Ears: External normal; canals clear; tympanic membranes normal  Oropharynx: Pink, supple. No suspicious lesions  Neck: Supple without thyromegaly, adenopathy  Lungs: Respirations unlabored; clear to auscultation bilaterally without wheeze, rales, rhonchi  CVS exam: normal rate and regular rhythm.  Neurologic: Alert and oriented; speech intact; face symmetrical; moves all extremities well; CNII-XII intact without focal deficit   Assessment:  1. Epididymitis   2. Dysuria   3. Atypical chest pain     Plan:  1. Will extend Doxycycline again as patient is requesting; however, will update labs today and will refer to urology due to persistent nature of symptoms. 2. Check U/A today and urine culture as well as PSA; 3. EKG shows NSR; update CXR today; follow-up to be determined.  This visit occurred during the SARS-CoV-2 public health emergency.  Safety protocols were in place, including screening questions prior to the visit, additional usage of staff PPE, and extensive cleaning of exam room while observing appropriate contact time as indicated for disinfecting solutions.     No follow-ups on file.  Orders Placed This Encounter  Procedures  . Urine Culture  . DG Chest 2 View    Standing Status:   Future    Number of Occurrences:   1    Standing Expiration Date:   08/10/2020    Order Specific Question:   Reason for  Exam (SYMPTOM  OR DIAGNOSIS REQUIRED)    Answer:   atypical chest pain    Order Specific Question:   Preferred imaging location?    Answer:   [name]    Order Specific Question:   Radiology Contrast Protocol - do NOT remove file path    Answer:   [email or web address]  . CBC with Differential/Platelet  . Comp Met (CMET)  . PSA  . Urinalysis  . Ambulatory referral to Urology    Referral Priority:   Routine    Referral Type:   Consultation    Referral Reason:   Specialty Services Required    Requested  Specialty:   Urology    Number of Visits Requested:   1    Requested Prescriptions   Signed Prescriptions Disp Refills  . doxycycline (VIBRA-TABS) 100 MG tablet 28 tablet 0    Sig: Take 1 tablet (100 mg total) by mouth 2 (two) times daily.

## 2019-08-11 NOTE — Addendum Note (Signed)
Addended by: Earnstine Regal on: 08/11/2019 03:05 PM   Modules accepted: Orders

## 2019-08-12 LAB — URINE CULTURE: Result:: NO GROWTH

## 2019-09-03 DIAGNOSIS — R3915 Urgency of urination: Secondary | ICD-10-CM | POA: Diagnosis not present

## 2019-11-03 ENCOUNTER — Other Ambulatory Visit: Payer: Medicare Other

## 2019-11-03 ENCOUNTER — Other Ambulatory Visit: Payer: Self-pay

## 2019-11-03 DIAGNOSIS — Z0000 Encounter for general adult medical examination without abnormal findings: Secondary | ICD-10-CM | POA: Diagnosis not present

## 2019-11-03 DIAGNOSIS — E538 Deficiency of other specified B group vitamins: Secondary | ICD-10-CM

## 2019-11-03 DIAGNOSIS — E559 Vitamin D deficiency, unspecified: Secondary | ICD-10-CM

## 2019-11-03 NOTE — Addendum Note (Signed)
Addended by: Hazle Quant on: 11/03/2019 09:05 AM   Modules accepted: Orders

## 2019-11-03 NOTE — Addendum Note (Signed)
Addended by: Hazle Quant on: 11/03/2019 09:06 AM   Modules accepted: Orders

## 2019-11-03 NOTE — Addendum Note (Signed)
Addended by: Hazle Quant on: 11/03/2019 09:02 AM   Modules accepted: Orders

## 2019-11-04 LAB — BASIC METABOLIC PANEL
BUN: 18 mg/dL (ref 7–25)
CO2: 30 mmol/L (ref 20–32)
Calcium: 9.3 mg/dL (ref 8.6–10.3)
Chloride: 103 mmol/L (ref 98–110)
Creat: 0.79 mg/dL (ref 0.70–1.18)
Glucose, Bld: 91 mg/dL (ref 65–99)
Potassium: 4.4 mmol/L (ref 3.5–5.3)
Sodium: 140 mmol/L (ref 135–146)

## 2019-11-04 LAB — HEPATIC FUNCTION PANEL
AG Ratio: 1.7 (calc) (ref 1.0–2.5)
ALT: 18 U/L (ref 9–46)
AST: 22 U/L (ref 10–35)
Albumin: 4.2 g/dL (ref 3.6–5.1)
Alkaline phosphatase (APISO): 46 U/L (ref 35–144)
Bilirubin, Direct: 0.3 mg/dL — ABNORMAL HIGH (ref 0.0–0.2)
Globulin: 2.5 g/dL (calc) (ref 1.9–3.7)
Indirect Bilirubin: 1.3 mg/dL (calc) — ABNORMAL HIGH (ref 0.2–1.2)
Total Bilirubin: 1.6 mg/dL — ABNORMAL HIGH (ref 0.2–1.2)
Total Protein: 6.7 g/dL (ref 6.1–8.1)

## 2019-11-04 LAB — CBC WITH DIFFERENTIAL/PLATELET
Absolute Monocytes: 663 cells/uL (ref 200–950)
Basophils Absolute: 33 cells/uL (ref 0–200)
Basophils Relative: 0.5 %
Eosinophils Absolute: 52 cells/uL (ref 15–500)
Eosinophils Relative: 0.8 %
HCT: 45.4 % (ref 38.5–50.0)
Hemoglobin: 15.3 g/dL (ref 13.2–17.1)
Lymphs Abs: 1216 cells/uL (ref 850–3900)
MCH: 32.4 pg (ref 27.0–33.0)
MCHC: 33.7 g/dL (ref 32.0–36.0)
MCV: 96.2 fL (ref 80.0–100.0)
MPV: 10.2 fL (ref 7.5–12.5)
Monocytes Relative: 10.2 %
Neutro Abs: 4537 cells/uL (ref 1500–7800)
Neutrophils Relative %: 69.8 %
Platelets: 249 10*3/uL (ref 140–400)
RBC: 4.72 10*6/uL (ref 4.20–5.80)
RDW: 13 % (ref 11.0–15.0)
Total Lymphocyte: 18.7 %
WBC: 6.5 10*3/uL (ref 3.8–10.8)

## 2019-11-04 LAB — LIPID PANEL
Cholesterol: 209 mg/dL — ABNORMAL HIGH (ref <200)
HDL: 82 mg/dL (ref >=40)
LDL Cholesterol (Calc): 115 mg/dL (calc) — ABNORMAL HIGH
Non-HDL Cholesterol (Calc): 127 mg/dL (calc) (ref <130)
Total CHOL/HDL Ratio: 2.5 (calc) (ref <5.0)
Triglycerides: 42 mg/dL (ref <150)

## 2019-11-04 LAB — URINALYSIS, ROUTINE W REFLEX MICROSCOPIC
Bilirubin Urine: NEGATIVE
Glucose, UA: NEGATIVE
Hgb urine dipstick: NEGATIVE
Leukocytes,Ua: NEGATIVE
Nitrite: NEGATIVE
Protein, ur: NEGATIVE
Specific Gravity, Urine: 1.023 (ref 1.001–1.03)
pH: 5.5 (ref 5.0–8.0)

## 2019-11-04 LAB — PSA: PSA: 0.4 ng/mL (ref <=4.0)

## 2019-11-04 LAB — VITAMIN B12: Vitamin B-12: >2000 pg/mL — ABNORMAL HIGH (ref 200–1100)

## 2019-11-04 LAB — VITAMIN D 25 HYDROXY (VIT D DEFICIENCY, FRACTURES): Vit D, 25-Hydroxy: 54 ng/mL (ref 30–100)

## 2019-11-04 LAB — TSH: TSH: 1.36 mIU/L (ref 0.40–4.50)

## 2019-11-11 ENCOUNTER — Ambulatory Visit (INDEPENDENT_AMBULATORY_CARE_PROVIDER_SITE_OTHER): Payer: Medicare Other | Admitting: Internal Medicine

## 2019-11-11 ENCOUNTER — Encounter: Payer: Self-pay | Admitting: Internal Medicine

## 2019-11-11 ENCOUNTER — Other Ambulatory Visit: Payer: Self-pay

## 2019-11-11 VITALS — BP 130/62 | HR 79 | Temp 98.2°F | Ht 71.0 in | Wt 195.0 lb

## 2019-11-11 DIAGNOSIS — Z0000 Encounter for general adult medical examination without abnormal findings: Secondary | ICD-10-CM

## 2019-11-11 NOTE — Progress Notes (Signed)
Subjective:    Patient ID: Shane Fischer, male    DOB: 1941/01/16, 79 y.o.   MRN: [id number]  HPI  Here for wellness and f/u;  Overall doing ok;  Pt denies Chest pain, worsening SOB, DOE, wheezing, orthopnea, PND, worsening LE edema, palpitations, dizziness or syncope.  Pt denies neurological change such as new headache, facial or extremity weakness.  Pt denies polydipsia, polyuria, or low sugar symptoms. Pt states overall good compliance with treatment and medications, good tolerability, and has been trying to follow appropriate diet.  Pt denies worsening depressive symptoms, suicidal ideation or panic. No fever, night sweats, wt loss, loss of appetite, or other constitutional symptoms.  Pt states good ability with ADL's, has low fall risk, home safety reviewed and adequate, no other significant changes in hearing or vision, and only occasionally active with exercise. ALso has ongoing talo navicular joint right foot chronic pain /djd that slows him down.  Moving from current home de to too many stairs.   BP Readings from Last 3 Encounters:  11/11/19 130/62  08/11/19 138/82  07/07/19 (!) 148/72   Wt Readings from Last 3 Encounters:  11/11/19 195 lb (88.5 kg)  08/11/19 193 lb 12.8 oz (87.9 kg)  07/07/19 195 lb 9.6 oz (88.7 kg)  [place] [facility].    Past Medical History:  Diagnosis Date  . ALLERGIC RHINITIS 06/07/2007  . Allergy   . Arthritis    right ankle - Sees Aluisio  . BENIGN PROSTATIC HYPERTROPHY 06/07/2007  . Cataract   . CELLULITIS/ABSCESS, TRUNK 07/27/2009  . Chronic sinusitis 06/23/2010  . Erectile dysfunction 06/23/2010  . GERD (gastroesophageal reflux disease) 06/17/2016  . Hearing loss    Bilateral does not wear hearing aids  . HYPERLIPIDEMIA 06/07/2007   Diet controlled- triglycerides are great   . Melanoma ([place]) 09/12/2011   Right arm, 2006 - Dr Kelle Darting -  pre melanoma   . Neuromuscular disorder (HCC)    neuropathy boot feet on toes   . OTITIS MEDIA, ACUTE, RIGHT  02/04/2009   Past Surgical History:  Procedure Laterality Date  . basal cell removal      left ear  . cataract Bilateral   . COLONOSCOPY    . inguinal herniorrhapy     right and left  . LIPOMA EXCISION Left 12/2018   Left upper arm. Dr [facility]  . MELANOMA EXCISION  2005   right arm  . POLYPECTOMY    . s/p left shoulder lipoma    . UPPER GASTROINTESTINAL ENDOSCOPY  02/02/2017   Ardis Hughs     reports that he has quit smoking. His smoking use included cigars and pipe. He has never used smokeless tobacco. He reports current alcohol use of about 5.0 standard drinks of alcohol per week. He reports that he does not use drugs. family history includes Cancer in his mother; Colon cancer (age of onset: 41) in his mother; Heart attack in an other family member; Sudden death (age of onset: 82) in his father. No Known Allergies Current Outpatient Medications on File Prior to Visit  Medication Sig Dispense Refill  . Cetirizine HCl 10 MG TBDP Take 1 tablet by mouth daily.    . Cyanocobalamin ([facility]S VITAMIN B12 PO) Take 1 tablet by mouth daily.    Marland Kitchen doxycycline (VIBRA-TABS) 100 MG tablet Take 1 tablet (100 mg total) by mouth 2 (two) times daily. 28 tablet 0  . Naproxen Sodium (ALEVE) 220 MG CAPS Aleve  prn    .  NON FORMULARY 500 mg daily. niacinamide    . nystatin-triamcinolone (MYCOLOG II) cream Apply 1 application topically 2 (two) times daily. 30 g 0  . pantoprazole (PROTONIX) 40 MG tablet Take 1 tablet (40 mg total) by mouth daily. 90 tablet 3  . VITAMIN D PO Take 1 tablet by mouth daily.     No current facility-administered medications on file prior to visit.   Review of Systems All otherwise neg per pt    Objective:   Physical Exam BP 130/62 (BP Location: Left Arm, Patient Position: Sitting, Cuff Size: Large)   Pulse 79   Temp 98.2 F (36.8 C) (Oral)   Ht 5\' 11"  (1.803 m)   Wt 195 lb (88.5 kg)   SpO2 97%   BMI 27.20 kg/m  VS noted,  Constitutional: Pt  appears in NAD HENT: Head: NCAT.  Right Ear: External ear normal.  Left Ear: External ear normal.  Eyes: . Pupils are equal, round, and reactive to light. Conjunctivae and EOM are normal Nose: without d/c or deformity Neck: Neck supple. Gross normal ROM Cardiovascular: Normal rate and regular rhythm.   Pulmonary/Chest: Effort normal and breath sounds without rales or wheezing.  Abd:  Soft, NT, ND, + BS, no organomegaly Neurological: Pt is alert. At baseline orientation, motor grossly intact Skin: Skin is warm. No rashes, other new lesions, no LE edema Psychiatric: Pt behavior is normal without agitation  All otherwise neg per pt Lab Results  Component Value Date   WBC 6.5 11/03/2019   HGB 15.3 11/03/2019   HCT 45.4 11/03/2019   PLT 249 11/03/2019   GLUCOSE 91 11/03/2019   CHOL 209 (H) 11/03/2019   TRIG 42 11/03/2019   HDL 82 11/03/2019   LDLDIRECT 132.6 09/12/2011   LDLCALC 115 (H) 11/03/2019   ALT 18 11/03/2019   AST 22 11/03/2019   NA 140 11/03/2019   K 4.4 11/03/2019   CL 103 11/03/2019   CREATININE 0.79 11/03/2019   BUN 18 11/03/2019   CO2 30 11/03/2019   TSH 1.36 11/03/2019   PSA 0.4 11/03/2019      Assessment & Plan:

## 2019-11-11 NOTE — Patient Instructions (Signed)
Please continue all other medications as before, and refills have been done if requested.  Please have the pharmacy call with any other refills you may need.  Please continue your efforts at being more active, low cholesterol diet, and weight control.  You are otherwise up to date with prevention measures today.  Please keep your appointments with your specialists as you may have planned  Good Luck in [facility]!  And let us know if you need anything further, and remember to request a copy of your medical records prior to the move (this may take several months to accomplish).

## 2019-11-16 ENCOUNTER — Encounter: Payer: Self-pay | Admitting: Internal Medicine

## 2019-11-16 NOTE — Assessment & Plan Note (Signed)
